# Patient Record
Sex: Male | Born: 1950 | Race: White | Hispanic: No | Smoking: Former smoker
Health system: Southern US, Community
[De-identification: ages and names within clinical notes are randomized; demographics above are authoritative.]

## PROBLEM LIST (undated history)

## (undated) DIAGNOSIS — I10 Essential (primary) hypertension: Secondary | ICD-10-CM

## (undated) DIAGNOSIS — I8393 Asymptomatic varicose veins of bilateral lower extremities: Secondary | ICD-10-CM

## (undated) DIAGNOSIS — I739 Peripheral vascular disease, unspecified: Secondary | ICD-10-CM

## (undated) DIAGNOSIS — I2510 Atherosclerotic heart disease of native coronary artery without angina pectoris: Secondary | ICD-10-CM

## (undated) DIAGNOSIS — E785 Hyperlipidemia, unspecified: Secondary | ICD-10-CM

## (undated) DIAGNOSIS — H349 Unspecified retinal vascular occlusion: Secondary | ICD-10-CM

## (undated) DIAGNOSIS — I779 Disorder of arteries and arterioles, unspecified: Secondary | ICD-10-CM

## (undated) DIAGNOSIS — H47019 Ischemic optic neuropathy, unspecified eye: Secondary | ICD-10-CM

## (undated) HISTORY — DX: Atherosclerotic heart disease of native coronary artery without angina pectoris: I25.10

## (undated) HISTORY — DX: Hyperlipidemia, unspecified: E78.5

## (undated) HISTORY — PX: EYE SURGERY: SHX253

## (undated) HISTORY — DX: Essential (primary) hypertension: I10

## (undated) HISTORY — DX: Peripheral vascular disease, unspecified: I73.9

## (undated) HISTORY — DX: Disorder of arteries and arterioles, unspecified: I77.9

## (undated) HISTORY — DX: Ischemic optic neuropathy, unspecified eye: H47.019

## (undated) HISTORY — PX: DENTAL SURGERY: SHX609

## (undated) HISTORY — DX: Asymptomatic varicose veins of bilateral lower extremities: I83.93

## (undated) HISTORY — DX: Unspecified retinal vascular occlusion: H34.9

---
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
## 2010-04-15 ENCOUNTER — Emergency Department (HOSPITAL_COMMUNITY): Admission: EM | Admit: 2010-04-15 | Discharge: 2010-04-16 | Payer: Self-pay | Admitting: Emergency Medicine

## 2010-11-09 LAB — DIFFERENTIAL
Basophils Absolute: 0.1 10*3/uL (ref 0.0–0.1)
Lymphocytes Relative: 37 % (ref 12–46)
Lymphs Abs: 1.7 10*3/uL (ref 0.7–4.0)
Monocytes Absolute: 0.4 10*3/uL (ref 0.1–1.0)
Neutro Abs: 2.3 10*3/uL (ref 1.7–7.7)
Neutrophils Relative %: 49 % (ref 43–77)

## 2010-11-09 LAB — CBC
HCT: 39.2 % (ref 39.0–52.0)
Hemoglobin: 14 g/dL (ref 13.0–17.0)
MCH: 31.4 pg (ref 26.0–34.0)
MCV: 87.9 fL (ref 78.0–100.0)
Platelets: 187 10*3/uL (ref 150–400)
RBC: 4.46 MIL/uL (ref 4.22–5.81)
WBC: 4.7 10*3/uL (ref 4.0–10.5)

## 2010-11-09 LAB — BASIC METABOLIC PANEL: Chloride: 105 mEq/L (ref 96–112)

## 2016-07-05 DIAGNOSIS — S61012A Laceration without foreign body of left thumb without damage to nail, initial encounter: Secondary | ICD-10-CM | POA: Diagnosis not present

## 2016-08-09 DIAGNOSIS — H34232 Retinal artery branch occlusion, left eye: Secondary | ICD-10-CM | POA: Diagnosis not present

## 2016-08-15 DIAGNOSIS — H47012 Ischemic optic neuropathy, left eye: Secondary | ICD-10-CM | POA: Diagnosis not present

## 2016-08-15 DIAGNOSIS — R002 Palpitations: Secondary | ICD-10-CM | POA: Diagnosis not present

## 2016-08-15 DIAGNOSIS — E668 Other obesity: Secondary | ICD-10-CM | POA: Diagnosis not present

## 2016-08-20 ENCOUNTER — Other Ambulatory Visit: Payer: Self-pay | Admitting: Cardiology

## 2016-08-20 ENCOUNTER — Ambulatory Visit (HOSPITAL_COMMUNITY)
Admission: RE | Admit: 2016-08-20 | Discharge: 2016-08-20 | Disposition: A | Discharge status: Home / Self Care | Payer: PPO | Source: Ambulatory Visit | Attending: Vascular Surgery | Admitting: Vascular Surgery

## 2016-08-20 DIAGNOSIS — H47019 Ischemic optic neuropathy, unspecified eye: Secondary | ICD-10-CM

## 2016-08-20 LAB — VAS US CAROTID
LCCADDIAS: 24 cm/s
LCCADSYS: 101 cm/s
LCCAPDIAS: 26 cm/s
LEFT ECA DIAS: -18 cm/s
LICADDIAS: -22 cm/s
LICAPDIAS: -20 cm/s
Left CCA prox sys: 145 cm/s
Left ICA dist sys: -71 cm/s
Left ICA prox sys: -83 cm/s
RCCADSYS: -69 cm/s
RCCAPSYS: 120 cm/s
RIGHT CCA MID DIAS: 29 cm/s
RIGHT ECA DIAS: -25 cm/s
Right CCA prox dias: 31 cm/s

## 2016-09-30 DIAGNOSIS — H34231 Retinal artery branch occlusion, right eye: Secondary | ICD-10-CM | POA: Diagnosis not present

## 2016-10-04 DIAGNOSIS — E785 Hyperlipidemia, unspecified: Secondary | ICD-10-CM | POA: Diagnosis not present

## 2016-10-04 DIAGNOSIS — E668 Other obesity: Secondary | ICD-10-CM | POA: Diagnosis not present

## 2016-10-04 DIAGNOSIS — H47012 Ischemic optic neuropathy, left eye: Secondary | ICD-10-CM | POA: Diagnosis not present

## 2016-10-04 DIAGNOSIS — R030 Elevated blood-pressure reading, without diagnosis of hypertension: Secondary | ICD-10-CM | POA: Diagnosis not present

## 2016-10-04 DIAGNOSIS — I119 Hypertensive heart disease without heart failure: Secondary | ICD-10-CM | POA: Diagnosis not present

## 2016-10-04 DIAGNOSIS — H34231 Retinal artery branch occlusion, right eye: Secondary | ICD-10-CM | POA: Diagnosis not present

## 2016-10-30 DIAGNOSIS — Z87891 Personal history of nicotine dependence: Secondary | ICD-10-CM | POA: Diagnosis not present

## 2016-10-30 DIAGNOSIS — L570 Actinic keratosis: Secondary | ICD-10-CM | POA: Diagnosis not present

## 2016-10-30 DIAGNOSIS — H47012 Ischemic optic neuropathy, left eye: Secondary | ICD-10-CM | POA: Diagnosis not present

## 2016-10-30 DIAGNOSIS — I519 Heart disease, unspecified: Secondary | ICD-10-CM | POA: Diagnosis not present

## 2016-10-30 DIAGNOSIS — E668 Other obesity: Secondary | ICD-10-CM | POA: Diagnosis not present

## 2016-10-30 DIAGNOSIS — Z125 Encounter for screening for malignant neoplasm of prostate: Secondary | ICD-10-CM | POA: Diagnosis not present

## 2016-10-30 DIAGNOSIS — I517 Cardiomegaly: Secondary | ICD-10-CM | POA: Diagnosis not present

## 2016-10-30 DIAGNOSIS — Z23 Encounter for immunization: Secondary | ICD-10-CM | POA: Diagnosis not present

## 2016-10-30 DIAGNOSIS — I119 Hypertensive heart disease without heart failure: Secondary | ICD-10-CM | POA: Diagnosis not present

## 2016-10-30 DIAGNOSIS — Z6829 Body mass index (BMI) 29.0-29.9, adult: Secondary | ICD-10-CM | POA: Diagnosis not present

## 2016-10-30 DIAGNOSIS — E7800 Pure hypercholesterolemia, unspecified: Secondary | ICD-10-CM | POA: Diagnosis not present

## 2016-10-30 DIAGNOSIS — I10 Essential (primary) hypertension: Secondary | ICD-10-CM | POA: Diagnosis not present

## 2016-11-01 ENCOUNTER — Encounter: Payer: Self-pay | Admitting: Gastroenterology

## 2016-11-13 DIAGNOSIS — H43813 Vitreous degeneration, bilateral: Secondary | ICD-10-CM | POA: Diagnosis not present

## 2016-11-13 DIAGNOSIS — H34832 Tributary (branch) retinal vein occlusion, left eye, with macular edema: Secondary | ICD-10-CM | POA: Diagnosis not present

## 2016-11-13 DIAGNOSIS — H34232 Retinal artery branch occlusion, left eye: Secondary | ICD-10-CM | POA: Diagnosis not present

## 2016-11-18 DIAGNOSIS — D485 Neoplasm of uncertain behavior of skin: Secondary | ICD-10-CM | POA: Diagnosis not present

## 2016-11-18 DIAGNOSIS — L280 Lichen simplex chronicus: Secondary | ICD-10-CM | POA: Diagnosis not present

## 2016-11-18 DIAGNOSIS — L570 Actinic keratosis: Secondary | ICD-10-CM | POA: Diagnosis not present

## 2016-11-18 DIAGNOSIS — L905 Scar conditions and fibrosis of skin: Secondary | ICD-10-CM | POA: Diagnosis not present

## 2016-11-20 ENCOUNTER — Ambulatory Visit (AMBULATORY_SURGERY_CENTER): Payer: Self-pay | Admitting: *Deleted

## 2016-11-20 VITALS — Ht 69.0 in | Wt 197.0 lb

## 2016-11-20 DIAGNOSIS — Z1211 Encounter for screening for malignant neoplasm of colon: Secondary | ICD-10-CM

## 2016-11-20 MED ORDER — NA SULFATE-K SULFATE-MG SULF 17.5-3.13-1.6 GM/177ML PO SOLN: 1.0000 | Freq: Once | ORAL | 0 refills | Status: AC

## 2016-11-20 NOTE — Progress Notes (Signed)
Pt states has had cardiac work up through Tollie Eth MD- cardio- had pt complete records release and to Dr Loletha Carrow" CMA. Vivien Rota todd CMA to obtain records- pt states no issues but carotid plaque. Will need echo report- pt informed   No egg or soy allergy known to patient  No issues with past sedation with any surgeries  or procedures, no intubation problems  No diet pills per patient No home 02 use per patient  No blood thinners per patient  Pt states recent  issues with  Mild constipation - uses metamucil daily  No A fib or A flutter   emmi video to e mail

## 2016-11-21 ENCOUNTER — Encounter: Payer: Self-pay | Admitting: Gastroenterology

## 2016-12-05 ENCOUNTER — Ambulatory Visit (AMBULATORY_SURGERY_CENTER): Payer: PPO | Admitting: Gastroenterology

## 2016-12-05 ENCOUNTER — Encounter: Payer: Self-pay | Admitting: Gastroenterology

## 2016-12-05 VITALS — BP 119/73 | HR 61 | Temp 98.9°F | Resp 12 | Ht 69.0 in | Wt 197.0 lb

## 2016-12-05 DIAGNOSIS — Z1212 Encounter for screening for malignant neoplasm of rectum: Secondary | ICD-10-CM

## 2016-12-05 DIAGNOSIS — Z1211 Encounter for screening for malignant neoplasm of colon: Secondary | ICD-10-CM

## 2016-12-05 MED ORDER — SODIUM CHLORIDE 0.9 % IV SOLN: 500.0000 mL | INTRAVENOUS | Status: DC

## 2016-12-05 NOTE — Op Note (Signed)
[place] Patient Name: Johnny Lopez Procedure Date: 12/05/2016 2:02 PM MRN: [id number] Endoscopist: Loretto Loletha Carrow , MD Age: 66 Referring MD:  Date of Birth: 20-Aug-1951 Gender: Male Account #: [id number] Procedure:                Colonoscopy Indications:              Screening for colorectal malignant neoplasm, This                            is the patient's first colonoscopy Medicines:                Monitored Anesthesia Care Procedure:                Pre-Anesthesia Assessment:                           - Prior to the procedure, a History and Physical                            was performed, and patient medications and                            allergies were reviewed. The patient's tolerance of                            previous anesthesia was also reviewed. The risks                            and benefits of the procedure and the sedation                            options and risks were discussed with the patient.                            All questions were answered, and informed consent                            was obtained. Anticoagulants: The patient has taken                            aspirin. It was decided not to withhold this                            medication prior to the procedure. ASA Grade                            Assessment: II - A patient with mild systemic                            disease. After reviewing the risks and benefits,                            the patient was deemed in satisfactory condition to  undergo the procedure.                           After obtaining informed consent, the colonoscope                            was passed under direct vision. Throughout the                            procedure, the patient's blood pressure, pulse, and                            oxygen saturations were monitored continuously. The                            Model CF-HQ190L [id number]) scope was introduced                           through the anus and advanced to the the cecum,                            identified by appendiceal orifice and ileocecal                            valve. The colonoscopy was performed without                            difficulty. The patient tolerated the procedure                            well. The quality of the bowel preparation was good                            after lavage. The ileocecal valve, appendiceal                            orifice, and rectum were photographed. The bowel                            preparation used was SUPREP. The quality of the                            bowel preparation was evaluated using the BBPS                            [facility] Bowel Preparation Scale) with scores of:                            Right Colon = 2, Transverse Colon = 2 and Left                            Colon = 2. The total BBPS score equals 6. Scope In: 2:09:04 PM Scope Out: 2:24:29 PM Scope Withdrawal Time: 0 hours 9 minutes 7 seconds  Total Procedure Duration: 0 hours 15 minutes 25 seconds  Findings:                 The perianal and digital rectal examinations were                            normal.                           Multiple diverticula were found in the left colon.                           Internal hemorrhoids were found during                            retroflexion. The hemorrhoids were medium-sized and                            Grade I (internal hemorrhoids that do not prolapse).                           The exam was otherwise without abnormality on                            direct and retroflexion views. Complications:            No immediate complications. Estimated Blood Loss:     Estimated blood loss: none. Impression:               - Diverticulosis in the left colon.                           - Internal hemorrhoids.                           - The examination was otherwise normal on direct                            and  retroflexion views.                           - No specimens collected. Recommendation:           - Patient has a contact number available for                            emergencies. The signs and symptoms of potential                            delayed complications were discussed with the                            patient. Return to normal activities tomorrow.                            Written discharge instructions were provided to the  patient.                           - Resume previous diet.                           - Continue present medications.                           - Repeat colonoscopy in 10 years for screening                            purposes. Kaedynce Tapp L. Loletha Carrow, MD 12/05/2016 2:28:34 PM This report has been signed electronically.

## 2016-12-05 NOTE — Patient Instructions (Signed)
**  Handouts given on diverticulosis and hemorrhoids**   YOU HAD AN ENDOSCOPIC PROCEDURE TODAY: Refer to the procedure report and other information in the discharge instructions given to you for any specific questions about what was found during the examination. If this information does not answer your questions, please call [place] office at [phone] to clarify.   YOU SHOULD EXPECT: Some feelings of bloating in the abdomen. Passage of more gas than usual. Walking can help get rid of the air that was put into your GI tract during the procedure and reduce the bloating. If you had a lower endoscopy (such as a colonoscopy or flexible sigmoidoscopy) you may notice spotting of blood in your stool or on the toilet paper. Some abdominal soreness may be present for a day or two, also.  DIET: Your first meal following the procedure should be a light meal and then it is ok to progress to your normal diet. A half-sandwich or bowl of soup is an example of a good first meal. Heavy or fried foods are harder to digest and may make you feel nauseous or bloated. Drink plenty of fluids but you should avoid alcoholic beverages for 24 hours. If you had a esophageal dilation, please see attached instructions for diet.    ACTIVITY: Your care partner should take you home directly after the procedure. You should plan to take it easy, moving slowly for the rest of the day. You can resume normal activity the day after the procedure however YOU SHOULD NOT DRIVE, use power tools, machinery or perform tasks that involve climbing or major physical exertion for 24 hours (because of the sedation medicines used during the test).   SYMPTOMS TO REPORT IMMEDIATELY: A gastroenterologist can be reached at any hour. Please call [phone]  for any of the following symptoms:  Following lower endoscopy (colonoscopy, flexible sigmoidoscopy) Excessive amounts of blood in the stool  Significant tenderness, worsening of abdominal pains   Swelling of the abdomen that is new, acute  Fever of 100 or higher    FOLLOW UP:  If any biopsies were taken you will be contacted by phone or by letter within the next 1-3 weeks. Call [phone]  if you have not heard about the biopsies in 3 weeks.  Please also call with any specific questions about appointments or follow up tests.  

## 2016-12-05 NOTE — Progress Notes (Signed)
Report given to PACU, vss 

## 2016-12-06 ENCOUNTER — Telehealth: Payer: Self-pay

## 2016-12-06 NOTE — Telephone Encounter (Signed)
  Follow up Call-  Call back number 12/05/2016  Post procedure Call Back phone  # [phone]  Permission to leave phone message Yes  Some recent data might be hidden     Patient questions:  Do you have a fever, pain , or abdominal swelling? No. Pain Score  0 *  Have you tolerated food without any problems? Yes.    Have you been able to return to your normal activities? Yes.    Do you have any questions about your discharge instructions: Diet   No. Medications  No. Follow up visit  No.  Do you have questions or concerns about your Care? No.  Actions: * If pain score is 4 or above: No action needed, pain <4.

## 2017-05-09 DIAGNOSIS — E668 Other obesity: Secondary | ICD-10-CM | POA: Diagnosis not present

## 2017-05-09 DIAGNOSIS — I119 Hypertensive heart disease without heart failure: Secondary | ICD-10-CM | POA: Diagnosis not present

## 2017-05-09 DIAGNOSIS — I10 Essential (primary) hypertension: Secondary | ICD-10-CM | POA: Diagnosis not present

## 2017-05-09 DIAGNOSIS — I519 Heart disease, unspecified: Secondary | ICD-10-CM | POA: Diagnosis not present

## 2017-05-09 DIAGNOSIS — Z87891 Personal history of nicotine dependence: Secondary | ICD-10-CM | POA: Diagnosis not present

## 2017-05-09 DIAGNOSIS — E7800 Pure hypercholesterolemia, unspecified: Secondary | ICD-10-CM | POA: Diagnosis not present

## 2017-05-09 DIAGNOSIS — H34232 Retinal artery branch occlusion, left eye: Secondary | ICD-10-CM | POA: Diagnosis not present

## 2017-05-09 DIAGNOSIS — Z6829 Body mass index (BMI) 29.0-29.9, adult: Secondary | ICD-10-CM | POA: Diagnosis not present

## 2017-05-09 DIAGNOSIS — H47012 Ischemic optic neuropathy, left eye: Secondary | ICD-10-CM | POA: Diagnosis not present

## 2017-05-09 DIAGNOSIS — I517 Cardiomegaly: Secondary | ICD-10-CM | POA: Diagnosis not present

## 2017-10-29 DIAGNOSIS — R82998 Other abnormal findings in urine: Secondary | ICD-10-CM | POA: Diagnosis not present

## 2017-10-29 DIAGNOSIS — I10 Essential (primary) hypertension: Secondary | ICD-10-CM | POA: Diagnosis not present

## 2017-10-29 DIAGNOSIS — Z125 Encounter for screening for malignant neoplasm of prostate: Secondary | ICD-10-CM | POA: Diagnosis not present

## 2017-10-29 DIAGNOSIS — E7800 Pure hypercholesterolemia, unspecified: Secondary | ICD-10-CM | POA: Diagnosis not present

## 2017-11-05 DIAGNOSIS — I519 Heart disease, unspecified: Secondary | ICD-10-CM | POA: Diagnosis not present

## 2017-11-05 DIAGNOSIS — I517 Cardiomegaly: Secondary | ICD-10-CM | POA: Diagnosis not present

## 2017-11-05 DIAGNOSIS — E668 Other obesity: Secondary | ICD-10-CM | POA: Diagnosis not present

## 2017-11-05 DIAGNOSIS — H47012 Ischemic optic neuropathy, left eye: Secondary | ICD-10-CM | POA: Diagnosis not present

## 2017-11-05 DIAGNOSIS — Z6827 Body mass index (BMI) 27.0-27.9, adult: Secondary | ICD-10-CM | POA: Diagnosis not present

## 2017-11-05 DIAGNOSIS — Z87891 Personal history of nicotine dependence: Secondary | ICD-10-CM | POA: Diagnosis not present

## 2017-11-05 DIAGNOSIS — I119 Hypertensive heart disease without heart failure: Secondary | ICD-10-CM | POA: Diagnosis not present

## 2017-11-05 DIAGNOSIS — H34232 Retinal artery branch occlusion, left eye: Secondary | ICD-10-CM | POA: Diagnosis not present

## 2017-11-05 DIAGNOSIS — E7800 Pure hypercholesterolemia, unspecified: Secondary | ICD-10-CM | POA: Diagnosis not present

## 2017-11-05 DIAGNOSIS — Z23 Encounter for immunization: Secondary | ICD-10-CM | POA: Diagnosis not present

## 2017-11-05 DIAGNOSIS — Z0000 Encounter for general adult medical examination without abnormal findings: Secondary | ICD-10-CM | POA: Diagnosis not present

## 2017-11-05 DIAGNOSIS — Z1389 Encounter for screening for other disorder: Secondary | ICD-10-CM | POA: Diagnosis not present

## 2018-01-08 DIAGNOSIS — Z1212 Encounter for screening for malignant neoplasm of rectum: Secondary | ICD-10-CM | POA: Diagnosis not present

## 2018-10-30 DIAGNOSIS — R82998 Other abnormal findings in urine: Secondary | ICD-10-CM | POA: Diagnosis not present

## 2018-10-30 DIAGNOSIS — E7800 Pure hypercholesterolemia, unspecified: Secondary | ICD-10-CM | POA: Diagnosis not present

## 2018-10-30 DIAGNOSIS — Z125 Encounter for screening for malignant neoplasm of prostate: Secondary | ICD-10-CM | POA: Diagnosis not present

## 2018-10-30 DIAGNOSIS — I10 Essential (primary) hypertension: Secondary | ICD-10-CM | POA: Diagnosis not present

## 2018-11-06 DIAGNOSIS — Z1331 Encounter for screening for depression: Secondary | ICD-10-CM | POA: Diagnosis not present

## 2018-11-06 DIAGNOSIS — I868 Varicose veins of other specified sites: Secondary | ICD-10-CM | POA: Diagnosis not present

## 2018-11-06 DIAGNOSIS — H34232 Retinal artery branch occlusion, left eye: Secondary | ICD-10-CM | POA: Diagnosis not present

## 2018-11-06 DIAGNOSIS — F5221 Male erectile disorder: Secondary | ICD-10-CM | POA: Diagnosis not present

## 2018-11-06 DIAGNOSIS — I119 Hypertensive heart disease without heart failure: Secondary | ICD-10-CM | POA: Diagnosis not present

## 2018-11-06 DIAGNOSIS — I517 Cardiomegaly: Secondary | ICD-10-CM | POA: Diagnosis not present

## 2018-11-06 DIAGNOSIS — H47012 Ischemic optic neuropathy, left eye: Secondary | ICD-10-CM | POA: Diagnosis not present

## 2018-11-06 DIAGNOSIS — E7800 Pure hypercholesterolemia, unspecified: Secondary | ICD-10-CM | POA: Diagnosis not present

## 2018-11-06 DIAGNOSIS — Z87891 Personal history of nicotine dependence: Secondary | ICD-10-CM | POA: Diagnosis not present

## 2018-11-06 DIAGNOSIS — E663 Overweight: Secondary | ICD-10-CM | POA: Diagnosis not present

## 2018-11-06 DIAGNOSIS — Z0000 Encounter for general adult medical examination without abnormal findings: Secondary | ICD-10-CM | POA: Diagnosis not present

## 2019-04-16 DIAGNOSIS — Z20828 Contact with and (suspected) exposure to other viral communicable diseases: Secondary | ICD-10-CM | POA: Diagnosis not present

## 2019-11-10 DIAGNOSIS — E7800 Pure hypercholesterolemia, unspecified: Secondary | ICD-10-CM | POA: Diagnosis not present

## 2019-11-10 DIAGNOSIS — Z125 Encounter for screening for malignant neoplasm of prostate: Secondary | ICD-10-CM | POA: Diagnosis not present

## 2019-11-16 DIAGNOSIS — I868 Varicose veins of other specified sites: Secondary | ICD-10-CM | POA: Diagnosis not present

## 2019-11-16 DIAGNOSIS — Z87891 Personal history of nicotine dependence: Secondary | ICD-10-CM | POA: Diagnosis not present

## 2019-11-16 DIAGNOSIS — I119 Hypertensive heart disease without heart failure: Secondary | ICD-10-CM | POA: Diagnosis not present

## 2019-11-16 DIAGNOSIS — H47012 Ischemic optic neuropathy, left eye: Secondary | ICD-10-CM | POA: Diagnosis not present

## 2019-11-16 DIAGNOSIS — Z1339 Encounter for screening examination for other mental health and behavioral disorders: Secondary | ICD-10-CM | POA: Diagnosis not present

## 2019-11-16 DIAGNOSIS — Z0000 Encounter for general adult medical examination without abnormal findings: Secondary | ICD-10-CM | POA: Diagnosis not present

## 2019-11-16 DIAGNOSIS — E669 Obesity, unspecified: Secondary | ICD-10-CM | POA: Diagnosis not present

## 2019-11-16 DIAGNOSIS — I519 Heart disease, unspecified: Secondary | ICD-10-CM | POA: Diagnosis not present

## 2019-11-16 DIAGNOSIS — Z1331 Encounter for screening for depression: Secondary | ICD-10-CM | POA: Diagnosis not present

## 2019-11-16 DIAGNOSIS — F5221 Male erectile disorder: Secondary | ICD-10-CM | POA: Diagnosis not present

## 2019-11-16 DIAGNOSIS — I517 Cardiomegaly: Secondary | ICD-10-CM | POA: Diagnosis not present

## 2019-11-16 DIAGNOSIS — H34232 Retinal artery branch occlusion, left eye: Secondary | ICD-10-CM | POA: Diagnosis not present

## 2019-11-16 DIAGNOSIS — D692 Other nonthrombocytopenic purpura: Secondary | ICD-10-CM | POA: Diagnosis not present

## 2019-11-16 DIAGNOSIS — E7800 Pure hypercholesterolemia, unspecified: Secondary | ICD-10-CM | POA: Diagnosis not present

## 2020-11-15 DIAGNOSIS — E7800 Pure hypercholesterolemia, unspecified: Secondary | ICD-10-CM | POA: Diagnosis not present

## 2020-11-15 DIAGNOSIS — Z125 Encounter for screening for malignant neoplasm of prostate: Secondary | ICD-10-CM | POA: Diagnosis not present

## 2020-11-22 DIAGNOSIS — I517 Cardiomegaly: Secondary | ICD-10-CM | POA: Diagnosis not present

## 2020-11-22 DIAGNOSIS — I868 Varicose veins of other specified sites: Secondary | ICD-10-CM | POA: Diagnosis not present

## 2020-11-22 DIAGNOSIS — I519 Heart disease, unspecified: Secondary | ICD-10-CM | POA: Diagnosis not present

## 2020-11-22 DIAGNOSIS — Z1339 Encounter for screening examination for other mental health and behavioral disorders: Secondary | ICD-10-CM | POA: Diagnosis not present

## 2020-11-22 DIAGNOSIS — E663 Overweight: Secondary | ICD-10-CM | POA: Diagnosis not present

## 2020-11-22 DIAGNOSIS — L570 Actinic keratosis: Secondary | ICD-10-CM | POA: Diagnosis not present

## 2020-11-22 DIAGNOSIS — I119 Hypertensive heart disease without heart failure: Secondary | ICD-10-CM | POA: Diagnosis not present

## 2020-11-22 DIAGNOSIS — R82998 Other abnormal findings in urine: Secondary | ICD-10-CM | POA: Diagnosis not present

## 2020-11-22 DIAGNOSIS — Z0000 Encounter for general adult medical examination without abnormal findings: Secondary | ICD-10-CM | POA: Diagnosis not present

## 2020-11-22 DIAGNOSIS — Z1331 Encounter for screening for depression: Secondary | ICD-10-CM | POA: Diagnosis not present

## 2020-11-22 DIAGNOSIS — E7800 Pure hypercholesterolemia, unspecified: Secondary | ICD-10-CM | POA: Diagnosis not present

## 2020-11-22 DIAGNOSIS — D692 Other nonthrombocytopenic purpura: Secondary | ICD-10-CM | POA: Diagnosis not present

## 2020-12-26 NOTE — Progress Notes (Signed)
Date:  12/27/2020   ID:  Pricilla Handler, DOB 1951/04/07, MRN [id number]  PCP:  Haywood Pao, MD  Cardiologist:  Rex Kras, DO, [facility] (established care 12/27/2020) Former Cardiology Providers: Dr. Wynonia Lawman  REASON FOR CONSULT: Diastolic HF, high risk lipids, Hypertensie heart diease without heart failure  REQUESTING PHYSICIAN:  Tisovec, Fransico Him, MD [address] [place],  [place] [address]  Chief Complaint  Patient presents with  . Hypertension  . Carotid artery disea  . Hyperlipidemia  . New Patient (Initial Visit)    HPI  Johnny Lopez is a 70 y.o. male who presents to the office with a chief complaint of " establish care and for hyperlipidemia, coronary artery disease, history of diastolic dysfunction." Patient's past medical history and cardiovascular risk factors include: Hyperlipidemia, former smoker, carotid artery atherosclerosis, retinal artery occlusion, ischemic optic neuropathy, hypertension, varicose veins, hypertensive heart disease, diastolic dysfunction, advanced age.   He is referred to the office at the request of Tisovec, Fransico Him, MD for evaluation of hypertensie heart diease without heart failure.  Patient presents today for evaluation for cardiovascular disease given his multiple cardiovascular risk factors.  He used to follow-up with Dr. Wynonia Lawman prior to his retirement and thereafter has not reestablish care until now.  Patient is currently asymptomatic and does not have any chest pain or shortness of breath at rest or with effort late activities.  No known coronary artery disease.  Patient works as a Games developer and lives a very active lifestyle and on the weekends walks his dog as well.  He has not noticed any change in physical endurance.  But admits that at times is difficult to follow a heart healthy diet which is contributing to his underlying comorbid conditions.   ALLERGIES: No Known Allergies  MEDICATION LIST PRIOR TO VISIT: Current Meds   Medication Sig  . aspirin 81 MG chewable tablet Chew by mouth daily.  Marland Kitchen atorvastatin (LIPITOR) 40 MG tablet Take 40 mg by mouth at bedtime.  . Coenzyme Q10 (CO Q 10 PO) Take 100 mg by mouth daily. Liquid  . Multiple Vitamin (MULTIVITAMIN) tablet Take 1 tablet by mouth daily.  . Multiple Vitamins-Minerals (OCUVITE EYE HEALTH FORMULA PO) Take 1 capsule by mouth daily.  . Omega-3 Fatty Acids (OMEGA-3 FISH OIL) 1200 MG CAPS Take 1,200 mg by mouth daily.   Current Facility-Administered Medications for the 12/27/20 encounter (Office Visit) with Rex Kras, DO  Medication  . 0.9 %  sodium chloride infusion     PAST MEDICAL HISTORY: Past Medical History:  Diagnosis Date  . Carotid artery disease (HCC)    little plaque build up in the carotids  . Hyperlipidemia    on meds  . Hypertension    past history -lost weight  . Ischemic optic neuropathy   . Retinal artery occlusion   . Varicose veins of both lower extremities     PAST SURGICAL HISTORY: Past Surgical History:  Procedure Laterality Date  . DENTAL SURGERY    . TONSILLECTOMY  1962    FAMILY HISTORY: The patient family history includes Cancer in his father; Stroke (age of onset: 57) in his mother.  SOCIAL HISTORY:  The patient  reports that he quit smoking about 26 years ago. His smoking use included cigarettes. He has a 37.50 pack-year smoking history. He has never used smokeless tobacco. He reports that he does not drink alcohol and does not use drugs.  REVIEW OF SYSTEMS: Review of Systems  Constitutional: Negative for chills and fever.  HENT: Negative for hoarse voice and nosebleeds.   Eyes: Negative for discharge, double vision and pain.  Cardiovascular: Negative for chest pain, claudication, dyspnea on exertion, leg swelling, near-syncope, orthopnea, palpitations, paroxysmal nocturnal dyspnea and syncope.  Respiratory: Negative for hemoptysis and shortness of breath.   Musculoskeletal: Negative for muscle cramps and  myalgias.  Gastrointestinal: Negative for abdominal pain, constipation, diarrhea, hematemesis, hematochezia, melena, nausea and vomiting.  Neurological: Negative for dizziness and light-headedness.    PHYSICAL EXAM: Vitals with BMI 12/27/2020 12/05/2016 12/05/2016  Height $Remov'5\' 9"'fevHQk$  - -  Weight 206 lbs - -  BMI 52.84 - -  Systolic 132 440 102  Diastolic 85 73 69  Pulse 70 61 67    CONSTITUTIONAL: Well-developed and well-nourished. No acute distress.  SKIN: Skin is warm and dry. No rash noted. No cyanosis. No pallor. No jaundice HEAD: Normocephalic and atraumatic.  EYES: No scleral icterus MOUTH/THROAT: Moist oral membranes.  NECK: No JVD present. No thyromegaly noted. Right carotid bruits  LYMPHATIC: No visible cervical adenopathy.  CHEST Normal respiratory effort. No intercostal retractions  LUNGS: Clear to auscultation bilaterally.  No stridor. No wheezes. No rales.  CARDIOVASCULAR: Regular rate and rhythm, positive S1-S2, no murmurs rubs or gallops appreciated. ABDOMINAL: No apparent ascites.  EXTREMITIES: No peripheral edema  HEMATOLOGIC: No significant bruising NEUROLOGIC: Oriented to person, place, and time. Nonfocal. Normal muscle tone.  PSYCHIATRIC: Normal mood and affect. Normal behavior. Cooperative  CARDIAC DATABASE: EKG: 12/27/2020: NSR, 69bpm, PRWP, without underlying injury pattern.   Echocardiogram: No results found for this or any previous visit from the past 1095 days.   Stress Testing: No results found for this or any previous visit from the past 1095 days.  Heart Catheterization: None  LABORATORY DATA: CBC Latest Ref Rng & Units 04/15/2010  WBC 4.0 - 10.5 K/uL 4.7  Hemoglobin 13.0 - 17.0 g/dL 14.0  Hematocrit 39.0 - 52.0 % 39.2  Platelets 150 - 400 K/uL 187    CMP Latest Ref Rng & Units 04/15/2010  Glucose 70 - 99 mg/dL 119(H)  BUN 6 - 23 mg/dL 12  Creatinine 0.4 - 1.5 mg/dL 0.91  Sodium 135 - 145 mEq/L 139  Potassium 3.5 - 5.1 mEq/L 3.9  Chloride 96  - 112 mEq/L 105  CO2 19 - 32 mEq/L 28  Calcium 8.4 - 10.5 mg/dL 9.4    Lipid Panel  No results found for: CHOL, TRIG, HDL, CHOLHDL, VLDL, LDLCALC, LDLDIRECT, LABVLDL  No components found for: NTPROBNP No results for input(s): PROBNP in the last 8760 hours. No results for input(s): TSH in the last 8760 hours.  BMP No results for input(s): NA, K, CL, CO2, GLUCOSE, BUN, CREATININE, CALCIUM, GFRNONAA, GFRAA in the last 8760 hours.  HEMOGLOBIN A1C No results found for: HGBA1C, MPG  External Labs: Collected: 11/15/2020 Hemoglobin 14.6 g/dL, hematocrit 44.6% Creatinine 0.9 mg/dL. eGFR: >60 mL/min per 1.73 m Lipid profile: Total cholesterol 183, triglycerides 72, HDL 43, LDL 126, non-HDL 140, apolipoprotein B 104 (normal 49-90)  IMPRESSION:    ICD-10-CM   1. Pure hypercholesterolemia  E78.00 CT CARDIAC SCORING (SELF PAY ONLY)    Lipid Panel With LDL/HDL Ratio    LDL cholesterol, direct    CMP14+EGFR  2. Primary hypertension  I10 EKG 12-Lead    PCV ECHOCARDIOGRAM COMPLETE  3. Former smoker  Z87.891   4. Atherosclerosis of both carotid arteries  I65.23 PCV CAROTID DUPLEX (BILATERAL)    CT CARDIAC SCORING (SELF PAY ONLY)  5. Hx of diastolic dysfunction  V25.36  RECOMMENDATIONS: Terrin Meddaugh is a 70 y.o. male whose past medical history and cardiac risk factors include: Hyperlipidemia, former smoker, carotid artery atherosclerosis, retinal artery occlusion, ischemic optic neuropathy, hypertension, varicose veins, hypertensive heart disease, diastolic dysfunction, advanced age.  Patient used to follow-up with Dr. Wynonia Lawman for management of his modifiable cardiovascular risk factors given his cardiovascular comorbidities.  However since his retirement he has not followed up with cardiology and is here to establish care.  Given the recent lipid profile from March 2022 patient informs me that his PCP uptitrated Lipitor to 40 mg p.o. nightly.  I agree with his recommendation.  We will  check a fasting lipid profile prior to the next office visit to reevaluate therapy.  Given his carotid bruit on examination and history of coronary artery atherosclerosis we will repeat a carotid duplex for further evaluation.  Continue aspirin and statin therapy.   Patient has history of carotid artery atherosclerosis, retinal artery occlusion, and ischemic optic neuropathy therefore we discussed further risk ratification with regards to ASCVD.  We will check a coronary artery calcium score for further risk stratification.  We will discuss stress testing at the next visit.  Patient has history of diastolic dysfunction and given his comorbid conditions such as hypertension, hyperlipidemia would recommend echocardiogram to evaluate for structural heart disease.  No other medication changes were recommended during today's encounter.  I will see the patient back in about 4 weeks time to reevaluate his lipid management and to review the test results that were ordered for diagnostic purposes.  FINAL MEDICATION LIST END OF ENCOUNTER: No orders of the defined types were placed in this encounter.   Medications Discontinued During This Encounter  Medication Reason  . OVER THE COUNTER MEDICATION Error     Current Outpatient Medications:  .  aspirin 81 MG chewable tablet, Chew by mouth daily., Disp: , Rfl:  .  atorvastatin (LIPITOR) 40 MG tablet, Take 40 mg by mouth at bedtime., Disp: , Rfl:  .  Coenzyme Q10 (CO Q 10 PO), Take 100 mg by mouth daily. Liquid, Disp: , Rfl:  .  Multiple Vitamin (MULTIVITAMIN) tablet, Take 1 tablet by mouth daily., Disp: , Rfl:  .  Multiple Vitamins-Minerals (OCUVITE EYE HEALTH FORMULA PO), Take 1 capsule by mouth daily., Disp: , Rfl:  .  Omega-3 Fatty Acids (OMEGA-3 FISH OIL) 1200 MG CAPS, Take 1,200 mg by mouth daily., Disp: , Rfl:   Current Facility-Administered Medications:  .  0.9 %  sodium chloride infusion, 500 mL, Intravenous, Continuous, Danis, Kirke Corin,  MD  Orders Placed This Encounter  Procedures  . CT CARDIAC SCORING (SELF PAY ONLY)  . Lipid Panel With LDL/HDL Ratio  . LDL cholesterol, direct  . CMP14+EGFR  . EKG 12-Lead  . PCV ECHOCARDIOGRAM COMPLETE  . PCV CAROTID DUPLEX (BILATERAL)    There are no Patient Instructions on file for this visit.   --Continue cardiac medications as reconciled in final medication list. --Return in about 4 weeks (around 01/24/2021) for Follow up HLD and , Review test results. Or sooner if needed. --Continue follow-up with your primary care physician regarding the management of your other chronic comorbid conditions.  Patient's questions and concerns were addressed to his satisfaction. He voices understanding of the instructions provided during this encounter.   This note was created using a voice recognition software as a result there may be grammatical errors inadvertently enclosed that do not reflect the nature of this encounter. Every attempt is made to correct such  errors.  Rex Kras, [place], [facility]  Pager: [phone] Office: [phone]

## 2020-12-27 ENCOUNTER — Ambulatory Visit: Payer: PPO | Admitting: Cardiology

## 2020-12-27 ENCOUNTER — Encounter: Payer: Self-pay | Admitting: Cardiology

## 2020-12-27 ENCOUNTER — Other Ambulatory Visit: Payer: Self-pay

## 2020-12-27 VITALS — BP 145/85 | HR 70 | Temp 98.0°F | Resp 16 | Ht 69.0 in | Wt 206.0 lb

## 2020-12-27 DIAGNOSIS — Z87891 Personal history of nicotine dependence: Secondary | ICD-10-CM

## 2020-12-27 DIAGNOSIS — E7800 Pure hypercholesterolemia, unspecified: Secondary | ICD-10-CM

## 2020-12-27 DIAGNOSIS — I6523 Occlusion and stenosis of bilateral carotid arteries: Secondary | ICD-10-CM

## 2020-12-27 DIAGNOSIS — I10 Essential (primary) hypertension: Secondary | ICD-10-CM

## 2020-12-27 DIAGNOSIS — Z8679 Personal history of other diseases of the circulatory system: Secondary | ICD-10-CM | POA: Diagnosis not present

## 2021-01-01 ENCOUNTER — Other Ambulatory Visit: Payer: Self-pay

## 2021-01-01 DIAGNOSIS — E7800 Pure hypercholesterolemia, unspecified: Secondary | ICD-10-CM

## 2021-01-16 ENCOUNTER — Inpatient Hospital Stay: Admission: RE | Admit: 2021-01-16 | Payer: PPO | Source: Ambulatory Visit

## 2021-01-16 ENCOUNTER — Other Ambulatory Visit: Payer: PPO

## 2021-01-23 ENCOUNTER — Other Ambulatory Visit: Payer: PPO

## 2021-01-30 ENCOUNTER — Other Ambulatory Visit: Payer: Self-pay

## 2021-01-30 ENCOUNTER — Ambulatory Visit: Payer: PPO

## 2021-01-30 DIAGNOSIS — I6523 Occlusion and stenosis of bilateral carotid arteries: Secondary | ICD-10-CM | POA: Diagnosis not present

## 2021-02-01 ENCOUNTER — Ambulatory Visit: Payer: PPO

## 2021-02-01 ENCOUNTER — Ambulatory Visit: Payer: PPO | Admitting: Cardiology

## 2021-02-01 ENCOUNTER — Other Ambulatory Visit: Payer: Self-pay

## 2021-02-01 DIAGNOSIS — I10 Essential (primary) hypertension: Secondary | ICD-10-CM | POA: Diagnosis not present

## 2021-02-05 ENCOUNTER — Ambulatory Visit
Admission: RE | Admit: 2021-02-05 | Discharge: 2021-02-05 | Disposition: A | Discharge status: Home / Self Care | Payer: PPO | Source: Ambulatory Visit | Attending: Cardiology | Admitting: Cardiology

## 2021-02-05 DIAGNOSIS — E7800 Pure hypercholesterolemia, unspecified: Secondary | ICD-10-CM

## 2021-02-05 DIAGNOSIS — I6523 Occlusion and stenosis of bilateral carotid arteries: Secondary | ICD-10-CM

## 2021-02-09 DIAGNOSIS — E7800 Pure hypercholesterolemia, unspecified: Secondary | ICD-10-CM | POA: Diagnosis not present

## 2021-02-10 LAB — LIPID PANEL WITH LDL/HDL RATIO
Cholesterol, Total: 185 mg/dL (ref 100–199)
HDL: 39 mg/dL — ABNORMAL LOW (ref >39)
LDL Chol Calc (NIH): 127 mg/dL — ABNORMAL HIGH (ref 0–99)
LDL/HDL Ratio: 3.3 ratio (ref 0.0–3.6)
Triglycerides: 101 mg/dL (ref 0–149)
VLDL Cholesterol Cal: 19 mg/dL (ref 5–40)

## 2021-02-10 LAB — CMP14+EGFR
ALT: 26 IU/L (ref 0–44)
AST: 33 IU/L (ref 0–40)
Albumin/Globulin Ratio: 2.3 — ABNORMAL HIGH (ref 1.2–2.2)
Albumin: 4.6 g/dL (ref 3.8–4.8)
Alkaline Phosphatase: 74 IU/L (ref 44–121)
BUN/Creatinine Ratio: 10 (ref 10–24)
BUN: 10 mg/dL (ref 8–27)
Bilirubin Total: 2.3 mg/dL — ABNORMAL HIGH (ref 0.0–1.2)
CO2: 25 mmol/L (ref 20–29)
Calcium: 9.3 mg/dL (ref 8.6–10.2)
Chloride: 100 mmol/L (ref 96–106)
Creatinine, Ser: 1.03 mg/dL (ref 0.76–1.27)
Globulin, Total: 2 g/dL (ref 1.5–4.5)
Glucose: 104 mg/dL — ABNORMAL HIGH (ref 65–99)
Potassium: 4.6 mmol/L (ref 3.5–5.2)
Sodium: 136 mmol/L (ref 134–144)
Total Protein: 6.6 g/dL (ref 6.0–8.5)
eGFR: 79 mL/min/{1.73_m2} (ref >59)

## 2021-02-10 LAB — LDL CHOLESTEROL, DIRECT: LDL Direct: 123 mg/dL — ABNORMAL HIGH (ref 0–99)

## 2021-02-12 NOTE — Progress Notes (Signed)
Called pt to inform him about his appt and lab results.

## 2021-02-13 ENCOUNTER — Other Ambulatory Visit: Payer: Self-pay

## 2021-02-13 ENCOUNTER — Ambulatory Visit: Payer: PPO | Admitting: Cardiology

## 2021-02-13 ENCOUNTER — Encounter: Payer: Self-pay | Admitting: Cardiology

## 2021-02-13 VITALS — BP 144/84 | HR 75 | Temp 97.6°F | Resp 16 | Ht 69.0 in | Wt 203.0 lb

## 2021-02-13 DIAGNOSIS — Z87891 Personal history of nicotine dependence: Secondary | ICD-10-CM

## 2021-02-13 DIAGNOSIS — E7800 Pure hypercholesterolemia, unspecified: Secondary | ICD-10-CM | POA: Diagnosis not present

## 2021-02-13 DIAGNOSIS — I2584 Coronary atherosclerosis due to calcified coronary lesion: Secondary | ICD-10-CM | POA: Diagnosis not present

## 2021-02-13 DIAGNOSIS — I2510 Atherosclerotic heart disease of native coronary artery without angina pectoris: Secondary | ICD-10-CM

## 2021-02-13 DIAGNOSIS — I77810 Thoracic aortic ectasia: Secondary | ICD-10-CM | POA: Diagnosis not present

## 2021-02-13 DIAGNOSIS — I10 Essential (primary) hypertension: Secondary | ICD-10-CM | POA: Diagnosis not present

## 2021-02-13 MED ORDER — ATORVASTATIN CALCIUM 80 MG PO TABS: 80.0000 mg | ORAL_TABLET | Freq: Every day | ORAL | 0 refills | Status: DC

## 2021-02-13 NOTE — Progress Notes (Signed)
Date:  02/13/2021   ID:  Pricilla Handler, DOB 1951/03/31, MRN [id number]  PCP:  Haywood Pao, MD  Cardiologist:  Rex Kras, DO, [facility] (established care 12/27/2020) Former Cardiology Providers: Dr. Wynonia Lawman  Date: 02/13/21 Last Office Visit: 12/27/2020  Chief Complaint  Patient presents with   Pure hypercholesterolemia   Results    Lipid profile and calcium score   Follow-up    HPI  Johnny Lopez is a 70 y.o. male who presents to the office with a chief complaint of " follow-up for management of coronary artery calcification and hyperlipidemia." Patient's past medical history and cardiovascular risk factors include: Hyperlipidemia, former smoker, carotid artery atherosclerosis, dilated ascending aorta, severe coronary artery calcification, retinal artery occlusion, ischemic optic neuropathy, hypertension, varicose veins, hypertensive heart disease, advanced age.   He is referred to the office at the request of Tisovec, Fransico Him, MD for evaluation of hypertensie heart diease without heart failure.  Patient works as a Games developer and lives a very active lifestyle but does not have any structured exercise program or daily routine that he follows.  He also states that his occupation is very busy and is unable to follow a heart healthy diet.  He was referred to cardiology for further evaluation and management.  Given his history of hypercholesterolemia at the last office visit recommended titration of Lipitor to 40 mg p.o. nightly and undergo a coronary calcium score for further risk stratification.  His  repeat lipid profile after up titration of statin therapy still notes an elevated LDL levels.  Patient's total coronary calcium score is 1593 placing him at the [place] percentile for age/sex/ethnicity matched cohorts.  In addition, patient had history of carotid artery atherosclerosis and was recommended to have a carotid duplex for reevaluation.  Results reviewed with him in great detail and  noted below for further reference.  Results of the echocardiogram reviewed with him in great detail and noted below for further reference.  ALLERGIES: No Known Allergies  MEDICATION LIST PRIOR TO VISIT: Current Meds  Medication Sig   aspirin 81 MG chewable tablet Chew by mouth daily.   atorvastatin (LIPITOR) 80 MG tablet Take 1 tablet (80 mg total) by mouth at bedtime.   Coenzyme Q10 (CO Q 10 PO) Take 100 mg by mouth daily. Liquid   Multiple Vitamin (MULTIVITAMIN) tablet Take 1 tablet by mouth daily.   Multiple Vitamins-Minerals (OCUVITE EYE HEALTH FORMULA PO) Take 1 capsule by mouth daily.   Omega-3 Fatty Acids (OMEGA-3 FISH OIL) 1200 MG CAPS Take 1,200 mg by mouth daily.   [DISCONTINUED] atorvastatin (LIPITOR) 40 MG tablet Take 40 mg by mouth at bedtime.   Current Facility-Administered Medications for the 02/13/21 encounter (Office Visit) with Terri Skains, Osmani Kersten, DO  Medication   0.9 %  sodium chloride infusion     PAST MEDICAL HISTORY: Past Medical History:  Diagnosis Date   Carotid artery disease ([place])    little plaque build up in the carotids   Coronary atherosclerosis due to calcified coronary lesion of native artery    Hyperlipidemia    on meds   Hypertension    past history -lost weight   Ischemic optic neuropathy    Retinal artery occlusion    Varicose veins of both lower extremities     PAST SURGICAL HISTORY: Past Surgical History:  Procedure Laterality Date   DENTAL SURGERY     TONSILLECTOMY  1962    FAMILY HISTORY: The patient family history includes Cancer in his father; Stroke (age of  onset: 21) in his mother.  SOCIAL HISTORY:  The patient  reports that he quit smoking about 26 years ago. His smoking use included cigarettes. He has a 37.50 pack-year smoking history. He has never used smokeless tobacco. He reports that he does not drink alcohol and does not use drugs.  REVIEW OF SYSTEMS: Review of Systems  Constitutional: Negative for chills and fever.   HENT:  Negative for hoarse voice and nosebleeds.   Eyes:  Negative for discharge, double vision and pain.  Cardiovascular:  Negative for chest pain, claudication, dyspnea on exertion, leg swelling, near-syncope, orthopnea, palpitations, paroxysmal nocturnal dyspnea and syncope.  Respiratory:  Negative for hemoptysis and shortness of breath.   Musculoskeletal:  Negative for muscle cramps and myalgias.  Gastrointestinal:  Negative for abdominal pain, constipation, diarrhea, hematemesis, hematochezia, melena, nausea and vomiting.  Neurological:  Negative for dizziness and light-headedness.   PHYSICAL EXAM: Vitals with BMI 02/13/2021 12/27/2020 12/05/2016  Height $Remov'5\' 9"'hrtyMD$  $Remove'5\' 9"'zXuBIpo$  -  Weight 203 lbs 206 lbs -  BMI 16.10 96.04 -  Systolic 540 981 191  Diastolic 84 85 73  Pulse 75 70 61    CONSTITUTIONAL: Well-developed and well-nourished. No acute distress.  SKIN: Skin is warm and dry. No rash noted. No cyanosis. No pallor. No jaundice HEAD: Normocephalic and atraumatic.  EYES: No scleral icterus MOUTH/THROAT: Moist oral membranes.  NECK: No JVD present. No thyromegaly noted. Right carotid bruits  LYMPHATIC: No visible cervical adenopathy.  CHEST Normal respiratory effort. No intercostal retractions  LUNGS: Clear to auscultation bilaterally.  No stridor. No wheezes. No rales.  CARDIOVASCULAR: Regular rate and rhythm, positive S1-S2, no murmurs rubs or gallops appreciated. ABDOMINAL: No apparent ascites.  EXTREMITIES: No peripheral edema  HEMATOLOGIC: No significant bruising NEUROLOGIC: Oriented to person, place, and time. Nonfocal. Normal muscle tone.  PSYCHIATRIC: Normal mood and affect. Normal behavior. Cooperative  CARDIAC DATABASE: EKG: 12/27/2020: NSR, 69bpm, PRWP, without underlying injury pattern.   Echocardiogram: 02/01/2021:  Normal LV systolic function with visual EF 55-60%. Left ventricle cavity is normal in size. Mild left ventricular hypertrophy. Normal global wall  motion.  Normal diastolic filling pattern, normal LAP.  Mild tricuspid regurgitation. No evidence of pulmonary hypertension. RVSP measures 31 mmHg.  IVC is normal with a respiratory response of <50%.  No prior study for comparison.  Coronary artery calcification scoring performed on 02/05/2021: Left main: 171 Left anterior descending: 625 Left circumflex: 670 Right coronary artery: 127 Total calcium score: 1593 AU, 92nd percentile Ascending aorta 40 mm-recommend annual imaging follow-up with CTA or MRA.   Stress Testing: No results found for this or any previous visit from the past 1095 days.  Heart Catheterization: None  Carotid artery duplex 01/30/2021:  Minimal plaque in the bilateral carotid arteries (heterogeneous plaque).  Antegrade right vertebral artery flow. Antegrade left vertebral artery  flow.  Follow up studies if clinically indicated.  LABORATORY DATA: CBC Latest Ref Rng & Units 04/15/2010  WBC 4.0 - 10.5 K/uL 4.7  Hemoglobin 13.0 - 17.0 g/dL 14.0  Hematocrit 39.0 - 52.0 % 39.2  Platelets 150 - 400 K/uL 187    CMP Latest Ref Rng & Units 02/09/2021 04/15/2010  Glucose 65 - 99 mg/dL 104(H) 119(H)  BUN 8 - 27 mg/dL 10 12  Creatinine 0.76 - 1.27 mg/dL 1.03 0.91  Sodium 134 - 144 mmol/L 136 139  Potassium 3.5 - 5.2 mmol/L 4.6 3.9  Chloride 96 - 106 mmol/L 100 105  CO2 20 - 29 mmol/L 25 28  Calcium  8.6 - 10.2 mg/dL 9.3 9.4  Total Protein 6.0 - 8.5 g/dL 6.6 -  Total Bilirubin 0.0 - 1.2 mg/dL 2.3(H) -  Alkaline Phos 44 - 121 IU/L 74 -  AST 0 - 40 IU/L 33 -  ALT 0 - 44 IU/L 26 -    Lipid Panel     Component Value Date/Time   CHOL 185 02/09/2021 0833   TRIG 101 02/09/2021 0833   HDL 39 (L) 02/09/2021 0833   LDLCALC 127 (H) 02/09/2021 0833   LDLDIRECT 123 (H) 02/09/2021 0834   LABVLDL 19 02/09/2021 0833    No components found for: NTPROBNP No results for input(s): PROBNP in the last 8760 hours. No results for input(s): TSH in the last 8760 hours.  BMP Recent Labs     02/09/21 0834  NA 136  K 4.6  CL 100  CO2 25  GLUCOSE 104*  BUN 10  CREATININE 1.03  CALCIUM 9.3    HEMOGLOBIN A1C No results found for: HGBA1C, MPG  External Labs: Collected: 11/15/2020 Hemoglobin 14.6 g/dL, hematocrit 44.6% Creatinine 0.9 mg/dL. eGFR: >60 mL/min per 1.73 m Lipid profile: Total cholesterol 183, triglycerides 72, HDL 43, LDL 126, non-HDL 140, apolipoprotein B 104 (normal 49-90)  IMPRESSION:    ICD-10-CM   1. Pure hypercholesterolemia  E78.00 Lipid Panel With LDL/HDL Ratio    LDL cholesterol, direct    CMP14+EGFR    2. Coronary atherosclerosis due to calcified coronary lesion  I25.10 PCV MYOCARDIAL PERFUSION WO LEXISCAN   I25.84 Lipid Panel With LDL/HDL Ratio    LDL cholesterol, direct    CMP14+EGFR    3. Ascending aorta dilatation (HCC)  I77.810     4. Primary hypertension  I10     5. Former smoker  Z87.891        RECOMMENDATIONS: Johnny Lopez is a 70 y.o. male whose past medical history and cardiac risk factors include: Hyperlipidemia, former smoker, carotid artery atherosclerosis, dilated ascending aorta, severe coronary artery calcification, retinal artery occlusion, ischemic optic neuropathy, hypertension, varicose veins, hypertensive heart disease, advanced age.   Hyperlipidemia, pure: Patient's cholesterol levels are not well controlled. LDL 126 followed by 123 mg/dL 6 weeks later. Increase Lipitor to 80 mg p.o. nightly. Educated on importance of reducing cholesterol rich foods. Repeat fasting lipid profile in 6 weeks.  Coronary atherosclerosis due to calcified coronary lesion: Continue aspirin 81 mg p.o. daily. Uptitrate statin therapy as discussed above. Educated on the importance of improving his modifiable cardiovascular risk factors. He denies angina pectoris/anginal equivalent.  However given his severe coronary artery calcification and multiple cardiovascular risk factors that shared decision is to proceed with stress test to  evaluate for reversible ischemia.  Ascending aorta dilatation: CC testing noted an ascending aorta of 40 mm. Educated on the importance of blood pressure management. Patient states that his home blood pressures are very well controlled. I have asked him to keep a log of his blood pressures and to review it with his PCP or to bring it in at the next office visit to see if medication is warranted. Will need repeat study in 1 year to reevaluate disease progression.  I would like to see him back in about 7 weeks to reevaluate his fasting lipid profile, blood pressure log, and assist him to improve his modifiable cardiovascular risk factors.  FINAL MEDICATION LIST END OF ENCOUNTER: Meds ordered this encounter  Medications   atorvastatin (LIPITOR) 80 MG tablet    Sig: Take 1 tablet (80 mg total) by mouth  at bedtime.    Dispense:  90 tablet    Refill:  0    Medications Discontinued During This Encounter  Medication Reason   atorvastatin (LIPITOR) 40 MG tablet Dose change     Current Outpatient Medications:    aspirin 81 MG chewable tablet, Chew by mouth daily., Disp: , Rfl:    atorvastatin (LIPITOR) 80 MG tablet, Take 1 tablet (80 mg total) by mouth at bedtime., Disp: 90 tablet, Rfl: 0   Coenzyme Q10 (CO Q 10 PO), Take 100 mg by mouth daily. Liquid, Disp: , Rfl:    Multiple Vitamin (MULTIVITAMIN) tablet, Take 1 tablet by mouth daily., Disp: , Rfl:    Multiple Vitamins-Minerals (OCUVITE EYE HEALTH FORMULA PO), Take 1 capsule by mouth daily., Disp: , Rfl:    Omega-3 Fatty Acids (OMEGA-3 FISH OIL) 1200 MG CAPS, Take 1,200 mg by mouth daily., Disp: , Rfl:   Current Facility-Administered Medications:    0.9 %  sodium chloride infusion, 500 mL, Intravenous, Continuous, Danis, Estill Cotta III, MD  Orders Placed This Encounter  Procedures   Lipid Panel With LDL/HDL Ratio   LDL cholesterol, direct   CMP14+EGFR   PCV MYOCARDIAL PERFUSION WO LEXISCAN    There are no Patient Instructions on file  for this visit.   --Continue cardiac medications as reconciled in final medication list. --Return in about 7 weeks (around 04/03/2021) for Follow up, Coronary artery calcification, Lipid. Or sooner if needed. --Continue follow-up with your primary care physician regarding the management of your other chronic comorbid conditions.  Patient's questions and concerns were addressed to his satisfaction. He voices understanding of the instructions provided during this encounter.   This note was created using a voice recognition software as a result there may be grammatical errors inadvertently enclosed that do not reflect the nature of this encounter. Every attempt is made to correct such errors.  Rex Kras, [place], [facility]  Pager: [phone] Office: [phone]

## 2021-03-21 ENCOUNTER — Ambulatory Visit: Payer: PPO

## 2021-03-21 ENCOUNTER — Other Ambulatory Visit: Payer: Self-pay

## 2021-03-21 DIAGNOSIS — I2584 Coronary atherosclerosis due to calcified coronary lesion: Secondary | ICD-10-CM

## 2021-03-21 DIAGNOSIS — I2510 Atherosclerotic heart disease of native coronary artery without angina pectoris: Secondary | ICD-10-CM

## 2021-03-29 NOTE — Progress Notes (Signed)
Called pt to inform him about his stress test. Pt understood

## 2021-04-02 DIAGNOSIS — E7800 Pure hypercholesterolemia, unspecified: Secondary | ICD-10-CM | POA: Diagnosis not present

## 2021-04-02 DIAGNOSIS — I2584 Coronary atherosclerosis due to calcified coronary lesion: Secondary | ICD-10-CM | POA: Diagnosis not present

## 2021-04-02 DIAGNOSIS — I2510 Atherosclerotic heart disease of native coronary artery without angina pectoris: Secondary | ICD-10-CM | POA: Diagnosis not present

## 2021-04-03 LAB — LIPID PANEL WITH LDL/HDL RATIO
Cholesterol, Total: 129 mg/dL (ref 100–199)
HDL: 35 mg/dL — ABNORMAL LOW (ref >39)
LDL Chol Calc (NIH): 74 mg/dL (ref 0–99)
LDL/HDL Ratio: 2.1 ratio (ref 0.0–3.6)
Triglycerides: 106 mg/dL (ref 0–149)
VLDL Cholesterol Cal: 20 mg/dL (ref 5–40)

## 2021-04-03 LAB — CMP14+EGFR
ALT: 22 IU/L (ref 0–44)
AST: 23 IU/L (ref 0–40)
Albumin/Globulin Ratio: 2 (ref 1.2–2.2)
Albumin: 4.2 g/dL (ref 3.8–4.8)
Alkaline Phosphatase: 69 IU/L (ref 44–121)
BUN/Creatinine Ratio: 8 — ABNORMAL LOW (ref 10–24)
BUN: 8 mg/dL (ref 8–27)
Bilirubin Total: 1.8 mg/dL — ABNORMAL HIGH (ref 0.0–1.2)
CO2: 23 mmol/L (ref 20–29)
Calcium: 9.2 mg/dL (ref 8.6–10.2)
Chloride: 106 mmol/L (ref 96–106)
Creatinine, Ser: 0.99 mg/dL (ref 0.76–1.27)
Globulin, Total: 2.1 g/dL (ref 1.5–4.5)
Glucose: 106 mg/dL — ABNORMAL HIGH (ref 65–99)
Potassium: 4.9 mmol/L (ref 3.5–5.2)
Sodium: 142 mmol/L (ref 134–144)
Total Protein: 6.3 g/dL (ref 6.0–8.5)
eGFR: 82 mL/min/{1.73_m2} (ref >59)

## 2021-04-03 LAB — LDL CHOLESTEROL, DIRECT: LDL Direct: 73 mg/dL (ref 0–99)

## 2021-04-04 NOTE — Progress Notes (Signed)
Tried calling patient --no answer

## 2021-04-05 ENCOUNTER — Encounter: Payer: Self-pay | Admitting: Cardiology

## 2021-04-05 ENCOUNTER — Other Ambulatory Visit: Payer: Self-pay

## 2021-04-05 ENCOUNTER — Ambulatory Visit: Payer: PPO | Admitting: Cardiology

## 2021-04-05 VITALS — BP 134/60 | HR 64 | Temp 98.0°F | Resp 16 | Ht 68.0 in | Wt 187.0 lb

## 2021-04-05 DIAGNOSIS — I2510 Atherosclerotic heart disease of native coronary artery without angina pectoris: Secondary | ICD-10-CM

## 2021-04-05 DIAGNOSIS — I10 Essential (primary) hypertension: Secondary | ICD-10-CM

## 2021-04-05 DIAGNOSIS — E7800 Pure hypercholesterolemia, unspecified: Secondary | ICD-10-CM | POA: Diagnosis not present

## 2021-04-05 DIAGNOSIS — I77810 Thoracic aortic ectasia: Secondary | ICD-10-CM | POA: Diagnosis not present

## 2021-04-05 DIAGNOSIS — Z87891 Personal history of nicotine dependence: Secondary | ICD-10-CM | POA: Diagnosis not present

## 2021-04-05 DIAGNOSIS — I2584 Coronary atherosclerosis due to calcified coronary lesion: Secondary | ICD-10-CM | POA: Diagnosis not present

## 2021-04-05 NOTE — Progress Notes (Signed)
ID:  Johnny Lopez, DOB 1951-02-24, MRN [id number]  PCP:  Gaspar Garbe, MD  Cardiologist:  Tessa Lerner, DO, [facility] (established care 12/27/2020) Former Cardiology Providers: Dr. Donnie Aho  Date: 04/05/21 Last Office Visit: 02/13/2021  Chief Complaint  Patient presents with   Coronary artery calcification   Hyperlipidemia   Follow-up    HPI  Johnny Lopez is a 70 y.o. male who presents to the office with a chief complaint of " follow-up for management of coronary artery calcification and hyperlipidemia." Patient's past medical history and cardiovascular risk factors include: Hyperlipidemia, former smoker, carotid artery atherosclerosis, dilated ascending aorta, severe coronary artery calcification, retinal artery occlusion, ischemic optic neuropathy, hypertension, varicose veins, hypertensive heart disease, advanced age.   He is referred to the office at the request of Tisovec, Adelfa Koh, MD for evaluation of hypertensie heart diease without heart failure.  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  He also states that his occupation is very busy and is unable to follow a heart healthy diet.  He was referred to cardiology for further evaluation and management.  Given his hypercholesterolemia and severe coronary artery calcification his statin therapy has been uptitrated in a stepwise fashion and at the last office visit his Lipitor was increased to 80 mg p.o. nightly.  In addition, he was recommended to undergo exercise stress test to evaluate for reversible ischemia given his degree of coronary calcification.  Most recent lipid profile reviewed 04/02/2021 which notes significant improvement in LDL compared to prior levels.  Exercise nuclear stress test notes good functional capacity for age and no convincing evidence of reversible myocardial ischemia.  Overall low risk study.  Since last  office visit he is doing well from a cardiovascular standpoint.  He denies any chest pain or shortness of breath at rest or with effort related activities.  No hospitalizations or urgent care visits for cardiovascular symptoms.  ALLERGIES: No Known Allergies  MEDICATION LIST PRIOR TO VISIT: Current Meds  Medication Sig   aspirin 81 MG chewable tablet Chew by mouth daily.   atorvastatin (LIPITOR) 80 MG tablet Take 1 tablet (80 mg total) by mouth at bedtime.   Coenzyme Q10 (CO Q 10 PO) Take 100 mg by mouth daily. Liquid   Multiple Vitamin (MULTIVITAMIN) tablet Take 1 tablet by mouth daily.   Multiple Vitamins-Minerals (OCUVITE EYE HEALTH FORMULA PO) Take 1 capsule by mouth daily.   Omega-3 Fatty Acids (OMEGA-3 FISH OIL) 1200 MG CAPS Take 1,200 mg by mouth daily.     PAST MEDICAL HISTORY: Past Medical History:  Diagnosis Date   Carotid artery disease (HCC)    little plaque build up in the carotids   Coronary atherosclerosis due to calcified coronary lesion of native artery    Hyperlipidemia    on meds   Hypertension    past history -lost weight   Ischemic optic neuropathy    Retinal artery occlusion    Varicose veins of both lower extremities     PAST SURGICAL HISTORY: Past Surgical History:  Procedure Laterality Date   DENTAL SURGERY     TONSILLECTOMY  1962    FAMILY HISTORY: The patient family history includes Cancer in his father; Stroke (age of onset: 10) in his mother.  SOCIAL HISTORY:  The patient  reports that he quit smoking about 26 years ago. His smoking use included cigarettes. He has a 37.50 pack-year smoking  history. He has never used smokeless tobacco. He reports that he does not drink alcohol and does not use drugs.  REVIEW OF SYSTEMS: Review of Systems  Constitutional: Negative for chills and fever.  HENT:  Negative for hoarse voice and nosebleeds.   Eyes:  Negative for discharge, double vision and pain.  Cardiovascular:  Negative for chest pain,  claudication, dyspnea on exertion, leg swelling, near-syncope, orthopnea, palpitations, paroxysmal nocturnal dyspnea and syncope.  Respiratory:  Negative for hemoptysis and shortness of breath.   Musculoskeletal:  Negative for muscle cramps and myalgias.  Gastrointestinal:  Negative for abdominal pain, constipation, diarrhea, hematemesis, hematochezia, melena, nausea and vomiting.  Neurological:  Negative for dizziness and light-headedness.   PHYSICAL EXAM: Vitals with BMI 04/05/2021 02/13/2021 12/27/2020  Height '5\' 8"'$  '5\' 9"'$  '5\' 9"'$   Weight 187 lbs 203 lbs 206 lbs  BMI 28.44 [id number]  Systolic [id number] 123456 [id number]  Diastolic 60 84 85  Pulse 64 75 70    CONSTITUTIONAL: Well-developed and well-nourished. No acute distress.  SKIN: Skin is warm and dry. No rash noted. No cyanosis. No pallor. No jaundice HEAD: Normocephalic and atraumatic.  EYES: No scleral icterus MOUTH/THROAT: Moist oral membranes.  NECK: No JVD present. No thyromegaly noted. Right carotid bruits  LYMPHATIC: No visible cervical adenopathy.  CHEST Normal respiratory effort. No intercostal retractions  LUNGS: Clear to auscultation bilaterally.  No stridor. No wheezes. No rales.  CARDIOVASCULAR: Regular rate and rhythm, positive S1-S2, no murmurs rubs or gallops appreciated. ABDOMINAL: No apparent ascites.  EXTREMITIES: No peripheral edema  HEMATOLOGIC: No significant bruising NEUROLOGIC: Oriented to person, place, and time. Nonfocal. Normal muscle tone.  PSYCHIATRIC: Normal mood and affect. Normal behavior. Cooperative  CARDIAC DATABASE: EKG: 12/27/2020: NSR, 69bpm, PRWP, without underlying injury pattern.   Echocardiogram: 02/01/2021:  Normal LV systolic function with visual EF 55-60%. Left ventricle cavity is normal in size. Mild left ventricular hypertrophy. Normal global wall  motion. Normal diastolic filling pattern, normal LAP.  Mild tricuspid regurgitation. No evidence of pulmonary hypertension. RVSP measures 31 mmHg.   IVC is normal with a respiratory response of <50%.  No prior study for comparison.  Coronary artery calcification scoring performed on 02/05/2021: Left main: 171 Left anterior descending: 625 Left circumflex: 670 Right coronary artery: 127 Total calcium score: 1593 AU, 92nd percentile Ascending aorta 40 mm-recommend annual imaging follow-up with CTA or MRA.   Stress Testing: Exercise Sestamibi stress test 03/21/2021: 1 Day Rest/Stress Protocol. Exercise time 7 minutes 29 seconds on Bruce protocol, achieved 9.34 METS, 98% of age-predicted maximum heart rate. Stress ECG negative for ischemia.  No convincing evidence of reversible myocardial ischemia or prior infarct.  Left ventricular ejection fraction is 56% with normal wall motion.  Low risk study.  Heart Catheterization: None  Carotid artery duplex 01/30/2021:  Minimal plaque in the bilateral carotid arteries (heterogeneous plaque).  Antegrade right vertebral artery flow. Antegrade left vertebral artery  flow.  Follow up studies if clinically indicated.  LABORATORY DATA: External Labs: Collected: 11/15/2020 Hemoglobin 14.6 g/dL, hematocrit 44.6% Creatinine 0.9 mg/dL. eGFR: >60 mL/min per 1.73 m Lipid profile: Total cholesterol 183, triglycerides 72, HDL 43, LDL 126, non-HDL 140, apolipoprotein B 104 (normal 49-90)  Lipid Panel     Component Value Date/Time   CHOL 129 04/02/2021 0808   TRIG 106 04/02/2021 0808   HDL 35 (L) 04/02/2021 0808   LDLCALC 74 04/02/2021 0808   LDLDIRECT 73 04/02/2021 0808   LABVLDL 20 04/02/2021 0808   BMP Recent Labs  02/09/21 0834 04/02/21 0808  NA 136 142  K 4.6 4.9  CL 100 106  CO2 25 23  GLUCOSE 104* 106*  BUN 10 8  CREATININE 1.03 0.99  CALCIUM 9.3 9.2   IMPRESSION:    ICD-10-CM   1. Pure hypercholesterolemia  E78.00     2. Coronary atherosclerosis due to calcified coronary lesion  I25.10    I25.84     3. Ascending aorta dilatation (HCC)  I77.810 PCV ECHOCARDIOGRAM  COMPLETE    4. Primary hypertension  I10     5. Former smoker  Z87.891        RECOMMENDATIONS: Johnny Lopez is a 70 y.o. male whose past medical history and cardiac risk factors include: Hyperlipidemia, former smoker, carotid artery atherosclerosis, dilated ascending aorta, severe coronary artery calcification, retinal artery occlusion, ischemic optic neuropathy, hypertension, varicose veins, hypertensive heart disease, advanced age.   Hyperlipidemia, pure: Improving LDL 126 followed by 123 mg/dL and now 73 mg/dL Continue the current dose of statin therapy. Does not endorse any myalgias or intolerances to statin therapy.  Coronary atherosclerosis due to calcified coronary lesion: Continue aspirin and statin therapy. Reviewed the results of the echocardiogram and stress test at today's office visit. No additional testing warranted at this time. Continue to monitor.  Ascending aorta dilatation: Coronary calcium report noted ascending aorta of 40 mm as of June 2022. Patient states that his home blood pressures are very well controlled with SBP between 945-038 mmHg and diastolic blood pressures between 80-85 mmHg. We will repeat an echocardiogram in June 2023 to reevaluate the dimensions of the ascending aorta.  If the dimensions are worsening we will proceed with a CT scan at that time.  As part of today's office visit reviewed the most recent labs from 04/02/2021 independently and management of hyperlipidemia discussed as noted above.  Also reviewed the nuclear stress test that was performed since last office visit.  His questions and concerns were addressed to his satisfaction.  FINAL MEDICATION LIST END OF ENCOUNTER: No orders of the defined types were placed in this encounter.   Medications Discontinued During This Encounter  Medication Reason   0.9 %  sodium chloride infusion       Current Outpatient Medications:    aspirin 81 MG chewable tablet, Chew by mouth daily., Disp: ,  Rfl:    atorvastatin (LIPITOR) 80 MG tablet, Take 1 tablet (80 mg total) by mouth at bedtime., Disp: 90 tablet, Rfl: 0   Coenzyme Q10 (CO Q 10 PO), Take 100 mg by mouth daily. Liquid, Disp: , Rfl:    Multiple Vitamin (MULTIVITAMIN) tablet, Take 1 tablet by mouth daily., Disp: , Rfl:    Multiple Vitamins-Minerals (OCUVITE EYE HEALTH FORMULA PO), Take 1 capsule by mouth daily., Disp: , Rfl:    Omega-3 Fatty Acids (OMEGA-3 FISH OIL) 1200 MG CAPS, Take 1,200 mg by mouth daily., Disp: , Rfl:    sildenafil (REVATIO) 20 MG tablet, Take 1 tablet by mouth as needed., Disp: , Rfl:   Orders Placed This Encounter  Procedures   PCV ECHOCARDIOGRAM COMPLETE   There are no Patient Instructions on file for this visit.   --Continue cardiac medications as reconciled in final medication list. --Return in about 11 months (around 03/05/2022) for Follow up , Coronary artery calcification, Aorta Dilatation. Or sooner if needed. --Continue follow-up with your primary care physician regarding the management of your other chronic comorbid conditions.  Patient's questions and concerns were addressed to his satisfaction. He voices understanding of  the instructions provided during this encounter.   This note was created using a voice recognition software as a result there may be grammatical errors inadvertently enclosed that do not reflect the nature of this encounter. Every attempt is made to correct such errors.  Rex Kras, [place], [facility]  Pager: [phone] Office: [phone]

## 2021-04-06 NOTE — Progress Notes (Signed)
Patient came in for an office visit results were discuss in visit

## 2021-05-21 ENCOUNTER — Other Ambulatory Visit: Payer: Self-pay | Admitting: Cardiology

## 2021-10-26 ENCOUNTER — Other Ambulatory Visit: Payer: Self-pay | Admitting: Cardiology

## 2021-12-03 DIAGNOSIS — I119 Hypertensive heart disease without heart failure: Secondary | ICD-10-CM | POA: Diagnosis not present

## 2021-12-03 DIAGNOSIS — E7800 Pure hypercholesterolemia, unspecified: Secondary | ICD-10-CM | POA: Diagnosis not present

## 2021-12-03 DIAGNOSIS — Z125 Encounter for screening for malignant neoplasm of prostate: Secondary | ICD-10-CM | POA: Diagnosis not present

## 2021-12-05 DIAGNOSIS — E7800 Pure hypercholesterolemia, unspecified: Secondary | ICD-10-CM | POA: Diagnosis not present

## 2021-12-05 DIAGNOSIS — R7989 Other specified abnormal findings of blood chemistry: Secondary | ICD-10-CM | POA: Diagnosis not present

## 2021-12-10 DIAGNOSIS — E663 Overweight: Secondary | ICD-10-CM | POA: Diagnosis not present

## 2021-12-10 DIAGNOSIS — F5221 Male erectile disorder: Secondary | ICD-10-CM | POA: Diagnosis not present

## 2021-12-10 DIAGNOSIS — Z1331 Encounter for screening for depression: Secondary | ICD-10-CM | POA: Diagnosis not present

## 2021-12-10 DIAGNOSIS — E7800 Pure hypercholesterolemia, unspecified: Secondary | ICD-10-CM | POA: Diagnosis not present

## 2021-12-10 DIAGNOSIS — R82998 Other abnormal findings in urine: Secondary | ICD-10-CM | POA: Diagnosis not present

## 2021-12-10 DIAGNOSIS — L570 Actinic keratosis: Secondary | ICD-10-CM | POA: Diagnosis not present

## 2021-12-10 DIAGNOSIS — I519 Heart disease, unspecified: Secondary | ICD-10-CM | POA: Diagnosis not present

## 2021-12-10 DIAGNOSIS — Z0000 Encounter for general adult medical examination without abnormal findings: Secondary | ICD-10-CM | POA: Diagnosis not present

## 2021-12-10 DIAGNOSIS — D692 Other nonthrombocytopenic purpura: Secondary | ICD-10-CM | POA: Diagnosis not present

## 2021-12-10 DIAGNOSIS — I119 Hypertensive heart disease without heart failure: Secondary | ICD-10-CM | POA: Diagnosis not present

## 2021-12-10 DIAGNOSIS — Z1339 Encounter for screening examination for other mental health and behavioral disorders: Secondary | ICD-10-CM | POA: Diagnosis not present

## 2021-12-10 DIAGNOSIS — Z23 Encounter for immunization: Secondary | ICD-10-CM | POA: Diagnosis not present

## 2022-02-18 ENCOUNTER — Other Ambulatory Visit: Payer: PPO

## 2022-03-06 ENCOUNTER — Ambulatory Visit: Payer: PPO | Admitting: Cardiology

## 2022-05-22 ENCOUNTER — Other Ambulatory Visit: Payer: Self-pay | Admitting: Cardiology

## 2022-06-07 DIAGNOSIS — H348321 Tributary (branch) retinal vein occlusion, left eye, with retinal neovascularization: Secondary | ICD-10-CM | POA: Diagnosis not present

## 2022-06-07 DIAGNOSIS — H34232 Retinal artery branch occlusion, left eye: Secondary | ICD-10-CM | POA: Diagnosis not present

## 2022-06-07 DIAGNOSIS — H3562 Retinal hemorrhage, left eye: Secondary | ICD-10-CM | POA: Diagnosis not present

## 2022-06-07 DIAGNOSIS — H43813 Vitreous degeneration, bilateral: Secondary | ICD-10-CM | POA: Diagnosis not present

## 2022-06-21 DIAGNOSIS — H348321 Tributary (branch) retinal vein occlusion, left eye, with retinal neovascularization: Secondary | ICD-10-CM | POA: Diagnosis not present

## 2022-06-24 ENCOUNTER — Other Ambulatory Visit: Payer: Self-pay

## 2022-06-24 ENCOUNTER — Encounter (HOSPITAL_COMMUNITY): Payer: Self-pay

## 2022-06-24 ENCOUNTER — Emergency Department (HOSPITAL_BASED_OUTPATIENT_CLINIC_OR_DEPARTMENT_OTHER): Payer: PPO

## 2022-06-24 ENCOUNTER — Emergency Department (HOSPITAL_COMMUNITY)
Admission: EM | Admit: 2022-06-24 | Discharge: 2022-06-24 | Discharge status: Against Medical Advice | Payer: PPO | Attending: Emergency Medicine | Admitting: Emergency Medicine

## 2022-06-24 ENCOUNTER — Encounter (HOSPITAL_BASED_OUTPATIENT_CLINIC_OR_DEPARTMENT_OTHER): Payer: Self-pay

## 2022-06-24 ENCOUNTER — Emergency Department (HOSPITAL_BASED_OUTPATIENT_CLINIC_OR_DEPARTMENT_OTHER)
Admission: EM | Admit: 2022-06-24 | Discharge: 2022-06-24 | Disposition: A | Discharge status: Home / Self Care | Payer: PPO | Source: Home / Self Care | Attending: Emergency Medicine | Admitting: Emergency Medicine

## 2022-06-24 ENCOUNTER — Emergency Department (HOSPITAL_COMMUNITY): Payer: PPO

## 2022-06-24 DIAGNOSIS — I10 Essential (primary) hypertension: Secondary | ICD-10-CM | POA: Insufficient documentation

## 2022-06-24 DIAGNOSIS — R000 Tachycardia, unspecified: Secondary | ICD-10-CM | POA: Diagnosis not present

## 2022-06-24 DIAGNOSIS — R0602 Shortness of breath: Secondary | ICD-10-CM | POA: Insufficient documentation

## 2022-06-24 DIAGNOSIS — R42 Dizziness and giddiness: Secondary | ICD-10-CM | POA: Insufficient documentation

## 2022-06-24 DIAGNOSIS — Z7982 Long term (current) use of aspirin: Secondary | ICD-10-CM | POA: Insufficient documentation

## 2022-06-24 DIAGNOSIS — R002 Palpitations: Secondary | ICD-10-CM | POA: Insufficient documentation

## 2022-06-24 DIAGNOSIS — Z5321 Procedure and treatment not carried out due to patient leaving prior to being seen by health care provider: Secondary | ICD-10-CM | POA: Diagnosis not present

## 2022-06-24 DIAGNOSIS — Z79899 Other long term (current) drug therapy: Secondary | ICD-10-CM | POA: Insufficient documentation

## 2022-06-24 DIAGNOSIS — R55 Syncope and collapse: Secondary | ICD-10-CM | POA: Diagnosis not present

## 2022-06-24 DIAGNOSIS — R079 Chest pain, unspecified: Secondary | ICD-10-CM | POA: Diagnosis not present

## 2022-06-24 DIAGNOSIS — M79662 Pain in left lower leg: Secondary | ICD-10-CM | POA: Insufficient documentation

## 2022-06-24 DIAGNOSIS — I2510 Atherosclerotic heart disease of native coronary artery without angina pectoris: Secondary | ICD-10-CM | POA: Insufficient documentation

## 2022-06-24 DIAGNOSIS — M79605 Pain in left leg: Secondary | ICD-10-CM | POA: Diagnosis not present

## 2022-06-24 LAB — BASIC METABOLIC PANEL
Anion gap: 13 (ref 5–15)
BUN: 8 mg/dL (ref 8–23)
CO2: 24 mmol/L (ref 22–32)
Calcium: 9.7 mg/dL (ref 8.9–10.3)
Chloride: 101 mmol/L (ref 98–111)
Creatinine, Ser: 1.05 mg/dL (ref 0.61–1.24)
GFR, Estimated: >60 mL/min (ref >60)
Glucose, Bld: 103 mg/dL — ABNORMAL HIGH (ref 70–99)
Potassium: 4 mmol/L (ref 3.5–5.1)
Sodium: 138 mmol/L (ref 135–145)

## 2022-06-24 LAB — TSH: TSH: 2.576 u[IU]/mL (ref 0.350–4.500)

## 2022-06-24 LAB — CBC WITH DIFFERENTIAL/PLATELET
Abs Immature Granulocytes: 0.01 10*3/uL (ref 0.00–0.07)
Basophils Absolute: 0 10*3/uL (ref 0.0–0.1)
Basophils Relative: 1 %
Eosinophils Absolute: 0 10*3/uL (ref 0.0–0.5)
Eosinophils Relative: 1 %
HCT: 45.8 % (ref 39.0–52.0)
Hemoglobin: 15.1 g/dL (ref 13.0–17.0)
Immature Granulocytes: 0 %
Lymphocytes Relative: 20 %
Lymphs Abs: 1.2 10*3/uL (ref 0.7–4.0)
MCH: 29.8 pg (ref 26.0–34.0)
MCHC: 33 g/dL (ref 30.0–36.0)
MCV: 90.3 fL (ref 80.0–100.0)
Monocytes Absolute: 0.5 10*3/uL (ref 0.1–1.0)
Monocytes Relative: 9 %
Neutro Abs: 4.1 10*3/uL (ref 1.7–7.7)
Neutrophils Relative %: 69 %
Platelets: 247 10*3/uL (ref 150–400)
RBC: 5.07 MIL/uL (ref 4.22–5.81)
RDW: 13.6 % (ref 11.5–15.5)
WBC: 5.8 10*3/uL (ref 4.0–10.5)
nRBC: 0 % (ref 0.0–0.2)

## 2022-06-24 LAB — TROPONIN I (HIGH SENSITIVITY)
Troponin I (High Sensitivity): 6 ng/L (ref <18)
Troponin I (High Sensitivity): 4 ng/L (ref <18)

## 2022-06-24 LAB — MAGNESIUM: Magnesium: 1.9 mg/dL (ref 1.7–2.4)

## 2022-06-24 LAB — D-DIMER, QUANTITATIVE: D-Dimer, Quant: 0.55 ug/mL-FEU — ABNORMAL HIGH (ref 0.00–0.50)

## 2022-06-24 LAB — BRAIN NATRIURETIC PEPTIDE: B Natriuretic Peptide: 11 pg/mL (ref 0.0–100.0)

## 2022-06-24 MED ORDER — LACTATED RINGERS IV BOLUS
1000.0000 mL | Freq: Once | INTRAVENOUS | Status: AC
  Administered 2022-06-24: 1000 mL via INTRAVENOUS

## 2022-06-24 NOTE — ED Provider Triage Note (Signed)
Emergency Medicine Provider Triage Evaluation Note  Ezariah Nace , a 71 y.o. male  was evaluated in triage.  Pt complains of racing heart and near syncope.  Symptoms have been intermittent for the past few days.  Distant history of smoking.  Known coronary atherosclerosis and peripheral vascular disease.  No active chest pain at this time  Review of Systems  Positive: Racing heart Negative: Chest pain  Physical Exam  BP (!) 158/112 (BP Location: Right Arm)   Pulse 81   Temp 97.7 F (36.5 C) (Oral)   Resp 18   SpO2 100%  Gen:   Awake, no distress   Resp:  Normal effort  MSK:   Moves extremities without difficulty  Other:  Regular rate and rhythm  Medical Decision Making  Medically screening exam initiated at 10:11 AM.  Appropriate orders placed.  Jahmire Ruffins was informed that the remainder of the evaluation will be completed by another provider, this initial triage assessment does not replace that evaluation, and the importance of remaining in the ED until their evaluation is complete.  Work up [employer], [place], PA-C 06/24/22 1012

## 2022-06-24 NOTE — ED Notes (Signed)
Pt c/o no dizziness at the moment. Also, no SOB. VSS. A/ox4

## 2022-06-24 NOTE — ED Triage Notes (Signed)
Patient complains of intermittent chest pain, dizziness and HTN the past week. Patient alert and oriented. No hx of cardiac problems.

## 2022-06-24 NOTE — ED Provider Notes (Signed)
[facility] EMERGENCY DEPT Provider Note   CSN: [id number] Arrival date & time: 06/24/22  1426     History {Add pertinent medical, surgical, social history, OB history to HPI:1} Chief Complaint  Patient presents with   Shortness of Breath   Dizziness    Johnny Lopez is a 71 y.o. male.   Shortness of Breath Dizziness Associated symptoms: shortness of breath        71 y.o. male whose past medical history and cardiac risk factors include: Hyperlipidemia, former smoker, carotid artery atherosclerosis, dilated ascending aorta, severe coronary artery calcification, retinal artery occlusion, ischemic optic neuropathy, hypertension, varicose veins, hypertensive heart disease  Home Medications Prior to Admission medications   Medication Sig Start Date End Date Taking? Authorizing Provider  aspirin 81 MG chewable tablet Chew by mouth daily.    [provider]  atorvastatin (LIPITOR) 80 MG tablet TAKE 1 TABLET(80 MG) BY MOUTH AT BEDTIME 05/22/22   Tolia, Sunit, DO  Coenzyme Q10 (CO Q 10 PO) Take 100 mg by mouth daily. Liquid    [provider]  Multiple Vitamin (MULTIVITAMIN) tablet Take 1 tablet by mouth daily.    [provider]  Multiple Vitamins-Minerals (OCUVITE EYE HEALTH FORMULA PO) Take 1 capsule by mouth daily.    [provider]  Omega-3 Fatty Acids (OMEGA-3 FISH OIL) 1200 MG CAPS Take 1,200 mg by mouth daily.    [provider]  sildenafil (REVATIO) 20 MG tablet Take 1 tablet by mouth as needed. 03/24/21   [provider]      Allergies    Patient has no known allergies.    Review of Systems   Review of Systems  Respiratory:  Positive for shortness of breath.   Neurological:  Positive for dizziness.    Physical Exam Updated Vital Signs BP 139/87   Pulse (!) 59   Temp 98.5 F (36.9 C) (Tympanic)   Resp 10   Ht '5\' 9"'$  (1.753 m)   Wt 79.4 kg   SpO2 98%   BMI 25.84 kg/m  Physical Exam  ED  Results / Procedures / Treatments   Labs (all labs ordered are listed, but only abnormal results are displayed) Labs Reviewed - No data to display  EKG None  Radiology DG Chest 2 View  Result Date: 06/24/2022 CLINICAL DATA:  71 year old male with near syncope. Intermittent chest pain and dizziness. EXAM: CHEST - 2 VIEW COMPARISON:  Cardiac CT 02/05/2021 and earlier. FINDINGS: Lung volumes and mediastinal contours are stable since 2011. Borderline hyperinflation and tortuous thoracic aorta. No cardiomegaly. Visualized tracheal air column is within normal limits. No pneumothorax, pulmonary edema, pleural effusion, or acute pulmonary opacity. No acute osseous abnormality identified. Negative visible bowel gas. IMPRESSION: No acute cardiopulmonary abnormality. Suspicion of chronic pulmonary hyperinflation. Tortuous thoracic aorta. Electronically Signed   By: Genevie Ann M.D.   On: 06/24/2022 10:35    Procedures Procedures  {Document cardiac monitor, telemetry assessment procedure when appropriate:1}  Medications Ordered in ED Medications - No data to display  ED Course/ Medical Decision Making/ A&P                           Medical Decision Making Amount and/or Complexity of Data Reviewed Labs: ordered. Radiology: ordered.   ***  {Document critical care time when appropriate:1} {Document review of labs and clinical decision tools ie heart score, Chads2Vasc2 etc:1}  {Document your independent review of radiology images, and any outside records:1} {Document  your discussion with family members, caretakers, and with consultants:1} {Document social determinants of health affecting pt's care:1} {Document your decision making why or why not admission, treatments were needed:1} Final Clinical Impression(s) / ED Diagnoses Final diagnoses:  None    Rx / DC Orders ED Discharge Orders     None

## 2022-06-24 NOTE — ED Notes (Signed)
Pt left due to PCP told him to go to [place] being he will be seen quicker.

## 2022-06-24 NOTE — ED Notes (Signed)
Patient denies pain and is resting comfortably.  

## 2022-06-24 NOTE — Discharge Instructions (Addendum)
Please follow-up with your cardiologist tomorrow as scheduled.   Your laboratory testing, extremity imaging and EKG were reassuring today.  Your symptoms improved following IV fluids.

## 2022-06-24 NOTE — ED Triage Notes (Addendum)
Pt c/o dizziness and SHOB started today. Denies any falls Had laser eye sx 06/22/22 Seeing spots in both eyes Pt was seen at [facility] and [facility] due to wait time  Had blood work this am at [facility]

## 2022-06-25 ENCOUNTER — Ambulatory Visit: Payer: PPO | Admitting: Cardiology

## 2022-06-25 ENCOUNTER — Encounter: Payer: Self-pay | Admitting: Cardiology

## 2022-06-25 VITALS — BP 145/84 | HR 66 | Temp 97.5°F | Resp 16 | Ht 69.0 in | Wt 184.2 lb

## 2022-06-25 DIAGNOSIS — E7800 Pure hypercholesterolemia, unspecified: Secondary | ICD-10-CM | POA: Diagnosis not present

## 2022-06-25 DIAGNOSIS — R42 Dizziness and giddiness: Secondary | ICD-10-CM

## 2022-06-25 DIAGNOSIS — I2510 Atherosclerotic heart disease of native coronary artery without angina pectoris: Secondary | ICD-10-CM

## 2022-06-25 DIAGNOSIS — I10 Essential (primary) hypertension: Secondary | ICD-10-CM

## 2022-06-25 DIAGNOSIS — Z87891 Personal history of nicotine dependence: Secondary | ICD-10-CM | POA: Diagnosis not present

## 2022-06-25 DIAGNOSIS — I77810 Thoracic aortic ectasia: Secondary | ICD-10-CM

## 2022-06-25 DIAGNOSIS — R072 Precordial pain: Secondary | ICD-10-CM | POA: Diagnosis not present

## 2022-06-25 DIAGNOSIS — I2584 Coronary atherosclerosis due to calcified coronary lesion: Secondary | ICD-10-CM | POA: Diagnosis not present

## 2022-06-25 NOTE — Progress Notes (Signed)
ID:  Johnny Lopez, DOB [birth year], MRN [id number]  PCP:  Haywood Pao, MD  Cardiologist:  Rex Kras, DO, [facility] (established care 12/27/2020) Former Cardiology Providers: Dr. Wynonia Lawman  Date: 06/25/22 Last Office Visit: 04/05/2021  Chief Complaint  Patient presents with   Hospitalization Follow-up   Dizziness    HPI  Johnny Lopez is a 71 y.o. male whose past medical history and cardiovascular risk factors include: Hyperlipidemia, former smoker, carotid artery atherosclerosis, dilated ascending aorta, severe coronary artery calcification, retinal artery occlusion, ischemic optic neuropathy, hypertension, varicose veins, advanced age.   Patient was referred to the practice for evaluation of heart disease.  He was noted to have hypercholesterolemia and severe coronary artery calcification.  He underwent ischemic work-up as outlined below and presents today for sick visit.  Yesterday he went to the ED due to symptoms of dizziness.  Patient states that he was told that this likely secondary to dehydration.  He was given IV fluids, EKG in the ED was nonischemic, renal function at baseline, high sensitive troponins negative x2, BNP within normal limits as well.  He was experiencing dizziness for at least 1 week, occurring daily, no improving or worsening factors, short-lived, no near-syncope or syncope.  He had laser procedure to his left eye on 06/21/2022 and 2 days later on 06/23/2022 had an episode of 20 minutes of vision change which he describes as "seeing zigzag lines."  He has not followed up with his ophthalmologist.  He has not some nonspecific precordial pain, describes it as a light twitch like sensation, nonexertional, does not resolve with rest, self-limited.  ALLERGIES: No Known Allergies  MEDICATION LIST PRIOR TO VISIT: Current Meds  Medication Sig   aspirin 81 MG chewable tablet Chew by mouth daily.   atorvastatin (LIPITOR) 80 MG tablet TAKE 1 TABLET(80 MG) BY MOUTH AT  BEDTIME   Coenzyme Q10 (CO Q 10 PO) Take 100 mg by mouth daily. Liquid   Omega-3 Fatty Acids (OMEGA-3 FISH OIL) 1200 MG CAPS Take 1,200 mg by mouth daily.   sildenafil (REVATIO) 20 MG tablet Take 1 tablet by mouth as needed.     PAST MEDICAL HISTORY: Past Medical History:  Diagnosis Date   Carotid artery disease ([place])    little plaque build up in the carotids   Coronary atherosclerosis due to calcified coronary lesion of native artery    Hyperlipidemia    on meds   Hypertension    past history -lost weight   Ischemic optic neuropathy    Retinal artery occlusion    Varicose veins of both lower extremities     PAST SURGICAL HISTORY: Past Surgical History:  Procedure Laterality Date   DENTAL SURGERY     EYE SURGERY     TONSILLECTOMY  1962    FAMILY HISTORY: The patient family history includes Cancer in his father; Stroke (age of onset: 15) in his mother.  SOCIAL HISTORY:  The patient  reports that he quit smoking about 27 years ago. His smoking use included cigarettes. He has a 37.50 pack-year smoking history. He has never used smokeless tobacco. He reports that he does not drink alcohol and does not use drugs.  REVIEW OF SYSTEMS: Review of Systems  Constitutional: Negative for chills and fever.  HENT:  Negative for hoarse voice and nosebleeds.   Eyes:  Negative for discharge, double vision and pain.  Cardiovascular:  Negative for chest pain, claudication, dyspnea on exertion, leg swelling, near-syncope, orthopnea, palpitations, paroxysmal nocturnal dyspnea and syncope.  Respiratory:  Negative for hemoptysis and shortness of breath.   Musculoskeletal:  Negative for muscle cramps and myalgias.  Gastrointestinal:  Negative for abdominal pain, constipation, diarrhea, hematemesis, hematochezia, melena, nausea and vomiting.  Neurological:  Positive for dizziness (Improving). Negative for light-headedness.    PHYSICAL EXAM:    06/25/2022    3:14 PM 06/24/2022    7:45 PM  06/24/2022    7:15 PM  Vitals with BMI  Height _0     Weight 184 lbs 3 oz    BMI 91.66    Systolic 060 045 997  Diastolic 84 83 87  Pulse 66 56    Orthostatic VS for the past 72 hrs (Last 3 readings):  Orthostatic BP Patient Position BP Location Cuff Size Orthostatic Pulse  06/25/22 1522 139/83 Standing Left Arm Large 71  06/25/22 1520 136/81 Sitting Left Arm Large 64  06/25/22 1519 136/74 Supine Left Arm Large 64   Physical Exam  Constitutional: No distress.  Age appropriate, hemodynamically stable.   Neck: No JVD present.  Cardiovascular: Normal rate, regular rhythm, S1 normal, S2 normal, intact distal pulses and normal pulses. Exam reveals no gallop, no S3 and no S4.  No murmur heard. Pulses:      Carotid pulses are  on the right side with bruit. Pulmonary/Chest: Effort normal and breath sounds normal. No stridor. He has no wheezes. He has no rales.  Abdominal: Soft. Bowel sounds are normal. He exhibits no distension. There is no abdominal tenderness.  Musculoskeletal:        General: No edema.     Cervical back: Neck supple.  Neurological: He is alert and oriented to person, place, and time. He has intact cranial nerves (2-12).  Skin: Skin is warm and moist.     CARDIAC DATABASE: EKG: 06/25/2022: Sinus bradycardia, 59 bpm, normal axis, without underlying ischemia injury pattern.  Echocardiogram: 02/01/2021:  Normal LV systolic function with visual EF 55-60%. Left ventricle cavity is normal in size. Mild left ventricular hypertrophy. Normal global wall  motion. Normal diastolic filling pattern, normal LAP.  Mild tricuspid regurgitation. No evidence of pulmonary hypertension. RVSP measures 31 mmHg.  IVC is normal with a respiratory response of <50%.  No prior study for comparison.  Coronary artery calcification scoring performed on 02/05/2021: Left main: 171 Left anterior descending: 625 Left circumflex: 670 Right coronary artery: 127 Total calcium score: 1593 AU,  92nd percentile Ascending aorta 40 mm-recommend annual imaging follow-up with CTA or MRA.   Stress Testing: Exercise Sestamibi stress test 03/21/2021: 1 Day Rest/Stress Protocol. Exercise time 7 minutes 29 seconds on Bruce protocol, achieved 9.34 METS, 98% of age-predicted maximum heart rate. Stress ECG negative for ischemia.  No convincing evidence of reversible myocardial ischemia or prior infarct.  Left ventricular ejection fraction is 56% with normal wall motion.  Low risk study.  Heart Catheterization: None  Carotid artery duplex 01/30/2021:  Minimal plaque in the bilateral carotid arteries (heterogeneous plaque).  Antegrade right vertebral artery flow. Antegrade left vertebral artery  flow.  Follow up studies if clinically indicated.  LABORATORY DATA: External Labs: Collected: 11/15/2020 Hemoglobin 14.6 g/dL, hematocrit 44.6% Creatinine 0.9 mg/dL. eGFR: >60 mL/min per 1.73 m Lipid profile: Total cholesterol 183, triglycerides 72, HDL 43, LDL 126, non-HDL 140, apolipoprotein B 104 (normal 49-90)  Lipid Panel     Component Value Date/Time   CHOL 129 04/02/2021 0808   TRIG 106 04/02/2021 0808   HDL 35 (L) 04/02/2021 0808   LDLCALC 74 04/02/2021 7414  LDLDIRECT 73 04/02/2021 0808   LABVLDL 20 04/02/2021 0808   BMP Recent Labs    06/24/22 1014  NA 138  K 4.0  CL 101  CO2 24  GLUCOSE 103*  BUN 8  CREATININE 1.05  CALCIUM 9.7  GFRNONAA >60   IMPRESSION:    ICD-10-CM   1. Dizziness  R42 EKG 12-Lead    2. Precordial pain  R07.2 PCV ECHOCARDIOGRAM COMPLETE    PCV CARDIAC STRESS TEST    3. Pure hypercholesterolemia  E78.00     4. Coronary atherosclerosis due to calcified coronary lesion  I25.10    I25.84     5. Ascending aorta dilatation (HCC)  I77.810     6. Primary hypertension  I10     7. Former smoker  Z87.891        RECOMMENDATIONS: Arjuna Doeden is a 71 y.o. male whose past medical history and cardiac risk factors include: Hyperlipidemia,  former smoker, carotid artery atherosclerosis, dilated ascending aorta, severe coronary artery calcification, retinal artery occlusion, ischemic optic neuropathy, hypertension, varicose veins, hypertensive heart disease, advanced age.   Dizziness Improving. Orthostatic vital signs negative. No documented episodes of hypotension to explain his dizziness. No associated nausea, vomiting, or gait imbalance-patient is made aware that if these symptoms occur in the setting of dizziness he should go to the closest ER via EMS as these are symptoms of stroke. I have asked him to discuss noncardiac causes of dizziness with his PCP. Monitor for now  Precordial pain Likely noncardiac. Last ischemic work-up in 2022 Plan echo and GXT  Pure hypercholesterolemia Improving Placed on statin therapy given his indexed LDL and severe coronary artery calcification. Monitor for now  Coronary atherosclerosis due to calcified coronary lesion Continue aspirin and statin therapy. Reemphasized importance of secondary prevention  Ascending aorta dilatation (HCC) Coronary calcium report noted ascending aorta of 40 mm as of June 2022. We will order an echocardiogram to reevaluate the dimensions of the ascending aorta.  If the dimensions are worsening we will proceed with a CT scan at that time.  Primary hypertension Office blood pressure not well controlled. I have asked him to keep a log of his blood pressures at home and to review with PCP to see if medication needs to be initiated. Reemphasized importance of a low-salt diet. Since he is having episodes of dizziness will avoid adding antihypertensive medications at today's office visit.  Former smoker Educated on the importance of continued smoking cessation.  FINAL MEDICATION LIST END OF ENCOUNTER: No orders of the defined types were placed in this encounter.   Medications Discontinued During This Encounter  Medication Reason   Multiple Vitamin  (MULTIVITAMIN) tablet    Multiple Vitamins-Minerals (OCUVITE EYE HEALTH FORMULA PO)       Current Outpatient Medications:    aspirin 81 MG chewable tablet, Chew by mouth daily., Disp: , Rfl:    atorvastatin (LIPITOR) 80 MG tablet, TAKE 1 TABLET(80 MG) BY MOUTH AT BEDTIME, Disp: 90 tablet, Rfl: 0   Coenzyme Q10 (CO Q 10 PO), Take 100 mg by mouth daily. Liquid, Disp: , Rfl:    Omega-3 Fatty Acids (OMEGA-3 FISH OIL) 1200 MG CAPS, Take 1,200 mg by mouth daily., Disp: , Rfl:    sildenafil (REVATIO) 20 MG tablet, Take 1 tablet by mouth as needed., Disp: , Rfl:   Orders Placed This Encounter  Procedures   PCV CARDIAC STRESS TEST   EKG 12-Lead   PCV ECHOCARDIOGRAM COMPLETE   There are no Patient Instructions  on file for this visit.   --Continue cardiac medications as reconciled in final medication list. --Return in about 6 months (around 12/24/2022) for Follow up dizziness and CAC and HLD. Or sooner if needed. --Continue follow-up with your primary care physician regarding the management of your other chronic comorbid conditions.  Patient's questions and concerns were addressed to his satisfaction. He voices understanding of the instructions provided during this encounter.   This note was created using a voice recognition software as a result there may be grammatical errors inadvertently enclosed that do not reflect the nature of this encounter. Every attempt is made to correct such errors.  Rex Kras, [place], [facility]  Pager: [phone] Office: [phone]

## 2022-06-28 DIAGNOSIS — H34232 Retinal artery branch occlusion, left eye: Secondary | ICD-10-CM | POA: Diagnosis not present

## 2022-06-28 DIAGNOSIS — Z23 Encounter for immunization: Secondary | ICD-10-CM | POA: Diagnosis not present

## 2022-06-28 DIAGNOSIS — H47012 Ischemic optic neuropathy, left eye: Secondary | ICD-10-CM | POA: Diagnosis not present

## 2022-06-28 DIAGNOSIS — R42 Dizziness and giddiness: Secondary | ICD-10-CM | POA: Diagnosis not present

## 2022-06-28 DIAGNOSIS — I517 Cardiomegaly: Secondary | ICD-10-CM | POA: Diagnosis not present

## 2022-06-28 DIAGNOSIS — I119 Hypertensive heart disease without heart failure: Secondary | ICD-10-CM | POA: Diagnosis not present

## 2022-06-28 DIAGNOSIS — I519 Heart disease, unspecified: Secondary | ICD-10-CM | POA: Diagnosis not present

## 2022-06-28 DIAGNOSIS — E7800 Pure hypercholesterolemia, unspecified: Secondary | ICD-10-CM | POA: Diagnosis not present

## 2022-07-12 ENCOUNTER — Other Ambulatory Visit: Payer: PPO

## 2022-08-02 ENCOUNTER — Other Ambulatory Visit: Payer: PPO

## 2022-09-20 DIAGNOSIS — H43813 Vitreous degeneration, bilateral: Secondary | ICD-10-CM | POA: Diagnosis not present

## 2022-09-20 DIAGNOSIS — H2513 Age-related nuclear cataract, bilateral: Secondary | ICD-10-CM | POA: Diagnosis not present

## 2022-09-20 DIAGNOSIS — H348321 Tributary (branch) retinal vein occlusion, left eye, with retinal neovascularization: Secondary | ICD-10-CM | POA: Diagnosis not present

## 2022-12-09 DIAGNOSIS — R7989 Other specified abnormal findings of blood chemistry: Secondary | ICD-10-CM | POA: Diagnosis not present

## 2022-12-09 DIAGNOSIS — E7800 Pure hypercholesterolemia, unspecified: Secondary | ICD-10-CM | POA: Diagnosis not present

## 2022-12-09 DIAGNOSIS — I519 Heart disease, unspecified: Secondary | ICD-10-CM | POA: Diagnosis not present

## 2022-12-09 DIAGNOSIS — Z125 Encounter for screening for malignant neoplasm of prostate: Secondary | ICD-10-CM | POA: Diagnosis not present

## 2022-12-17 DIAGNOSIS — I868 Varicose veins of other specified sites: Secondary | ICD-10-CM | POA: Diagnosis not present

## 2022-12-17 DIAGNOSIS — D692 Other nonthrombocytopenic purpura: Secondary | ICD-10-CM | POA: Diagnosis not present

## 2022-12-17 DIAGNOSIS — E7800 Pure hypercholesterolemia, unspecified: Secondary | ICD-10-CM | POA: Diagnosis not present

## 2022-12-17 DIAGNOSIS — I119 Hypertensive heart disease without heart failure: Secondary | ICD-10-CM | POA: Diagnosis not present

## 2022-12-17 DIAGNOSIS — Z1339 Encounter for screening examination for other mental health and behavioral disorders: Secondary | ICD-10-CM | POA: Diagnosis not present

## 2022-12-17 DIAGNOSIS — Z1331 Encounter for screening for depression: Secondary | ICD-10-CM | POA: Diagnosis not present

## 2022-12-17 DIAGNOSIS — L570 Actinic keratosis: Secondary | ICD-10-CM | POA: Diagnosis not present

## 2022-12-17 DIAGNOSIS — F5221 Male erectile disorder: Secondary | ICD-10-CM | POA: Diagnosis not present

## 2022-12-17 DIAGNOSIS — E663 Overweight: Secondary | ICD-10-CM | POA: Diagnosis not present

## 2022-12-17 DIAGNOSIS — H34232 Retinal artery branch occlusion, left eye: Secondary | ICD-10-CM | POA: Diagnosis not present

## 2022-12-17 DIAGNOSIS — R82998 Other abnormal findings in urine: Secondary | ICD-10-CM | POA: Diagnosis not present

## 2022-12-17 DIAGNOSIS — Z87891 Personal history of nicotine dependence: Secondary | ICD-10-CM | POA: Diagnosis not present

## 2022-12-23 ENCOUNTER — Ambulatory Visit: Payer: PPO | Admitting: Cardiology

## 2022-12-31 ENCOUNTER — Ambulatory Visit: Payer: PPO | Admitting: Cardiology

## 2023-01-16 IMAGING — CT CT CARDIAC CORONARY ARTERY CALCIUM SCORE
3 series · 14 of 20 positions shown, 16 images · non-contrast
Comparison: No priors.

CLINICAL DATA: 69-year-old Caucasian male with history of elevated
cholesterol and family history of coronary artery disease. Former
smoker.

EXAM:
CT CARDIAC CORONARY ARTERY CALCIUM SCORE
TECHNIQUE: Non-contrast imaging through the heart was performed using
prospective ECG gating. Image post processing was performed on an
independent workstation, allowing for quantitative analysis of the
heart and coronary arteries. Note that this exam targets the heart
and the chest was not imaged in its entirety.

[Series 2: calcium scoring 2.00 qr36 bestdiast 70% hrt calciu · axial · 0.37mm/px · z∈[+1675,+1761]mm · 4 of 73 slices shown]
[im 15/73  vessel]
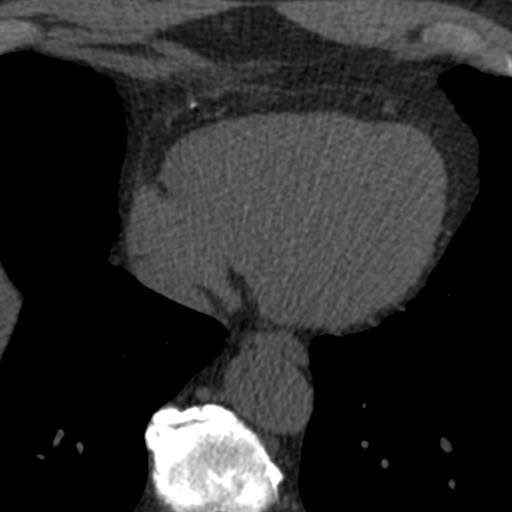
[im 29/73  vessel]
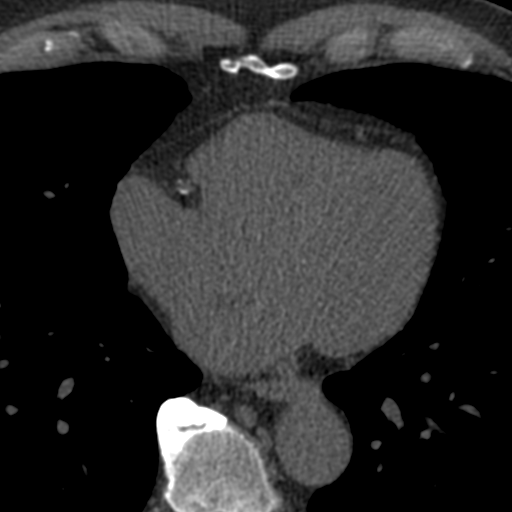
[im 44/73  vessel]
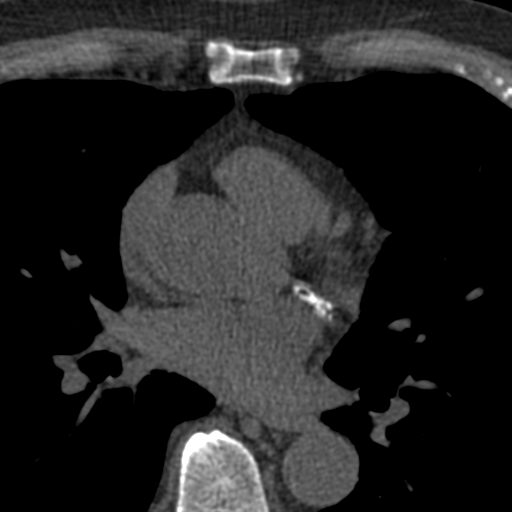
[im 58/73  vessel]
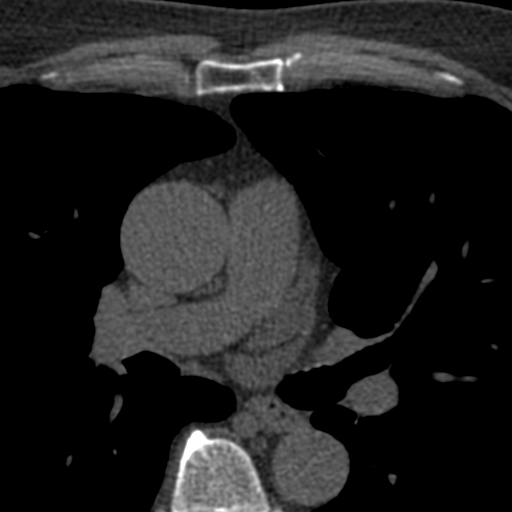

[Series 3: calcium scoring 2.00 br40 bestdiast 70% axial · axial · 0.62mm/px · z∈[+1671,+1767]mm · 5 of 73 slices shown, 7 images]
[im 13/73  vessel]
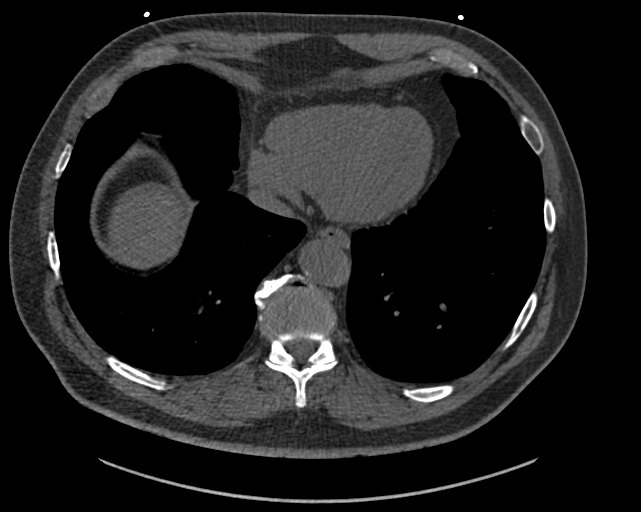
[im 13/73  lung]
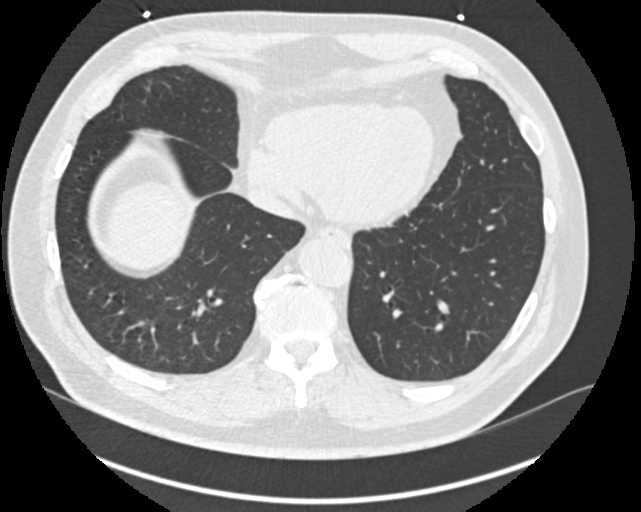
[im 25/73  vessel]
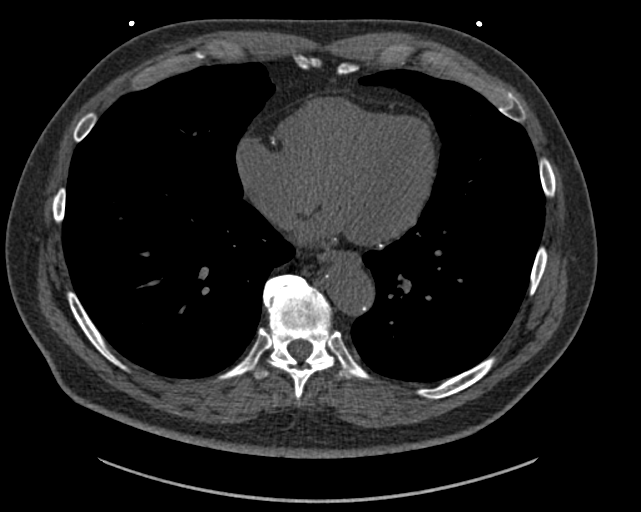
[im 37/73  vessel]
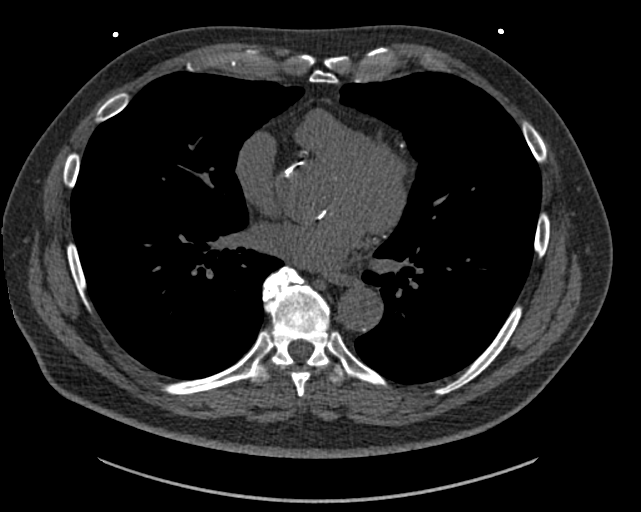
[im 49/73  vessel]
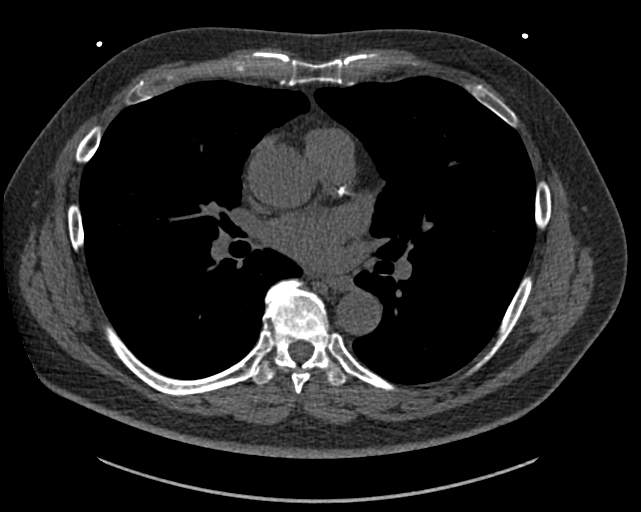
[im 61/73  vessel]
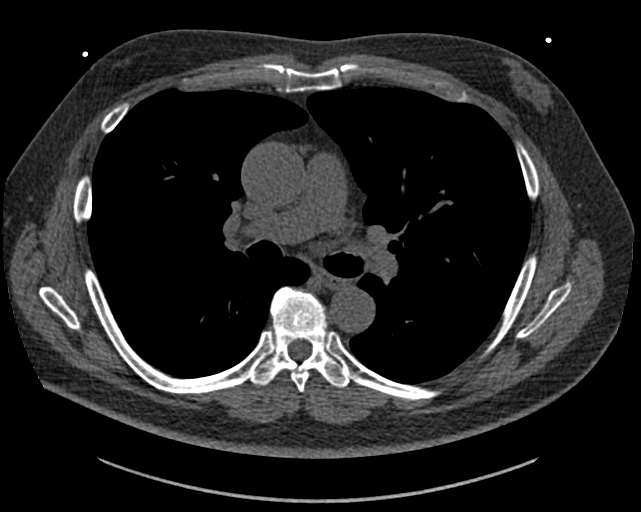
[im 61/73  lung]
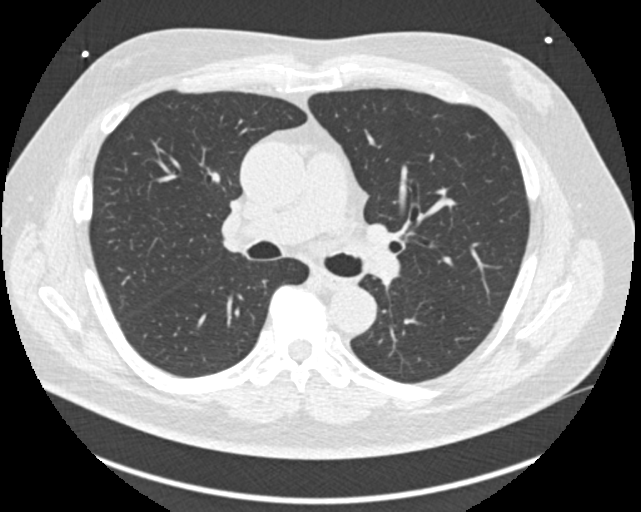

[Series 9: calcium scoring 2.00 br60 bestdiast 70% lungs · axial · 0.62mm/px · z∈[+1671,+1767]mm · 5 of 73 slices shown]
[im 13/73  vessel]
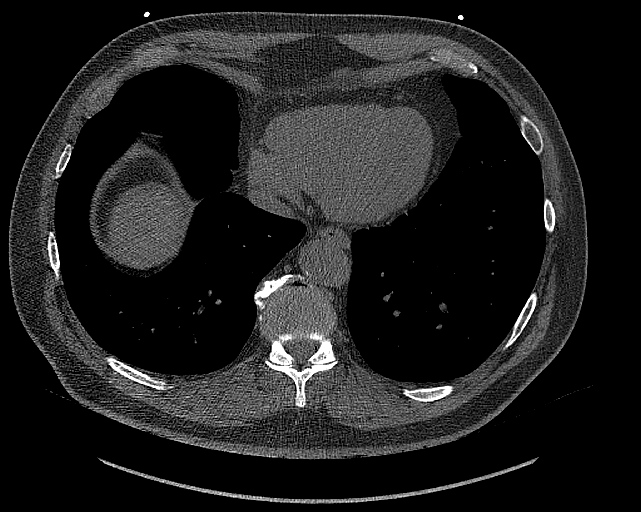
[im 25/73  vessel]
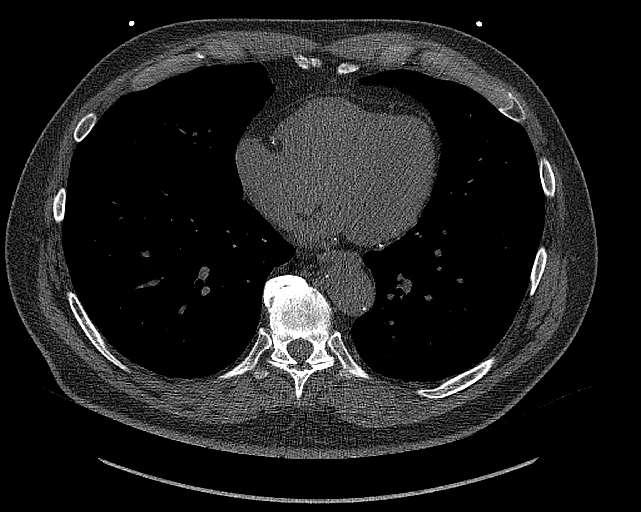
[im 37/73  vessel]
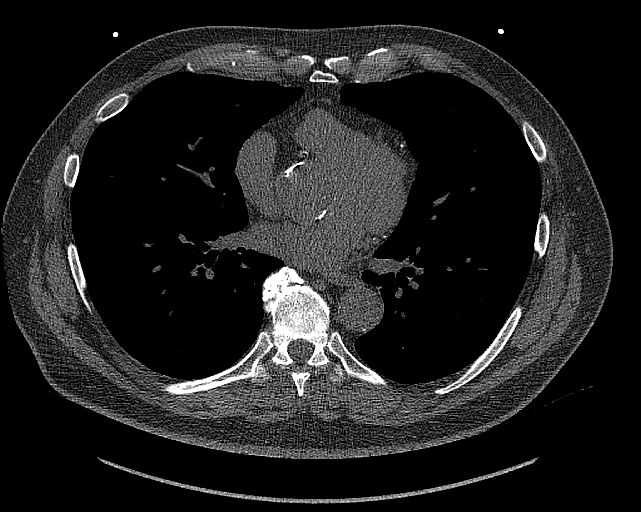
[im 49/73  vessel]
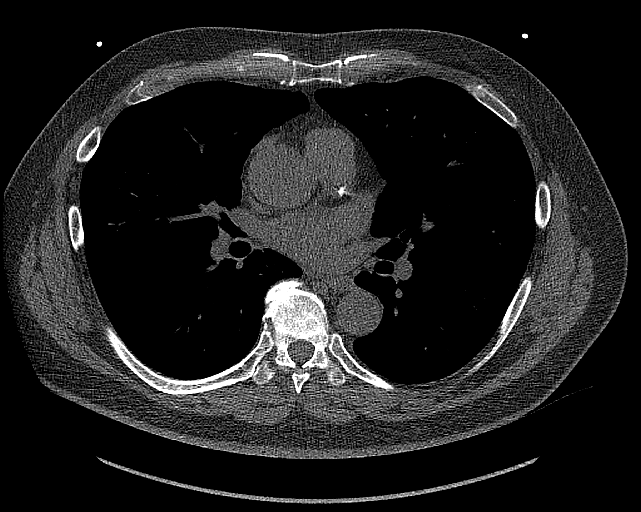
[im 61/73  vessel]
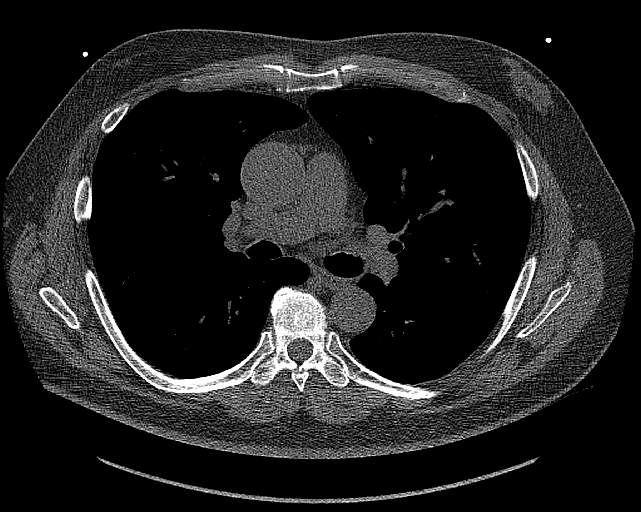

[14 of 20 positions shown; findings below may reference images not displayed]

FINDINGS: CORONARY CALCIUM SCORES:

Left Main: 171

LAD: 625

LCx: 670

RCA: 127

Total Agatston Score: 1,593

[HOSPITAL] percentile: 92nd

AORTA MEASUREMENTS:

Ascending Aorta: 40 mm

Descending Aorta: 27 mm

EXTRACARDIAC FINDINGS:

Within the visualized portions of the thorax there are no suspicious
appearing pulmonary nodules or masses, there is no acute
consolidative airspace disease, no pleural effusions, no
pneumothorax and no lymphadenopathy. Visualized portions of the
upper abdomen are unremarkable. There are no aggressive appearing
lytic or blastic lesions noted in the visualized portions of the
skeleton.
IMPRESSION: 1. Patient's total coronary artery calcium score is 1,593 which is
92nd percentile for patient's of matched age, gender and
race/ethnicity. Please note that although the presence of coronary
artery calcium documents the presence of coronary artery disease,
the severity of this disease and any potential stenosis cannot be
assessed on this noncontrast CT examination. Assessment for
potential risk factor modification, dietary therapy or pharmacologic
therapy may be warranted, if clinically indicated.
2. Ectasia of ascending thoracic aorta (4.0 cm in diameter).
Recommend annual imaging followup by CTA or MRA. This recommendation
follows 9909 ACCF/AHA/AATS/ACR/ASA/SCA/AYAZ/SALLAHI/NILE/FRASZT Guidelines
for the Diagnosis and Management of Patients with Thoracic Aortic
Disease. Circulation. 9909; 121: E266-e369. Aortic aneurysm NOS
(7JBM4-AF8.0).

## 2023-06-24 DIAGNOSIS — L814 Other melanin hyperpigmentation: Secondary | ICD-10-CM | POA: Diagnosis not present

## 2023-06-24 DIAGNOSIS — D485 Neoplasm of uncertain behavior of skin: Secondary | ICD-10-CM | POA: Diagnosis not present

## 2023-07-28 DIAGNOSIS — D225 Melanocytic nevi of trunk: Secondary | ICD-10-CM | POA: Diagnosis not present

## 2023-07-28 DIAGNOSIS — L821 Other seborrheic keratosis: Secondary | ICD-10-CM | POA: Diagnosis not present

## 2023-07-28 DIAGNOSIS — L814 Other melanin hyperpigmentation: Secondary | ICD-10-CM | POA: Diagnosis not present

## 2023-07-28 DIAGNOSIS — Z7189 Other specified counseling: Secondary | ICD-10-CM | POA: Diagnosis not present

## 2023-07-28 DIAGNOSIS — C44311 Basal cell carcinoma of skin of nose: Secondary | ICD-10-CM | POA: Diagnosis not present

## 2023-10-22 DIAGNOSIS — C44311 Basal cell carcinoma of skin of nose: Secondary | ICD-10-CM | POA: Diagnosis not present

## 2023-12-22 DIAGNOSIS — Z1212 Encounter for screening for malignant neoplasm of rectum: Secondary | ICD-10-CM | POA: Diagnosis not present

## 2023-12-22 DIAGNOSIS — Z125 Encounter for screening for malignant neoplasm of prostate: Secondary | ICD-10-CM | POA: Diagnosis not present

## 2023-12-22 DIAGNOSIS — E7800 Pure hypercholesterolemia, unspecified: Secondary | ICD-10-CM | POA: Diagnosis not present

## 2023-12-22 DIAGNOSIS — I119 Hypertensive heart disease without heart failure: Secondary | ICD-10-CM | POA: Diagnosis not present

## 2023-12-29 DIAGNOSIS — I119 Hypertensive heart disease without heart failure: Secondary | ICD-10-CM | POA: Diagnosis not present

## 2023-12-29 DIAGNOSIS — D692 Other nonthrombocytopenic purpura: Secondary | ICD-10-CM | POA: Diagnosis not present

## 2023-12-29 DIAGNOSIS — L570 Actinic keratosis: Secondary | ICD-10-CM | POA: Diagnosis not present

## 2023-12-29 DIAGNOSIS — E663 Overweight: Secondary | ICD-10-CM | POA: Diagnosis not present

## 2023-12-29 DIAGNOSIS — R82998 Other abnormal findings in urine: Secondary | ICD-10-CM | POA: Diagnosis not present

## 2023-12-29 DIAGNOSIS — Z1331 Encounter for screening for depression: Secondary | ICD-10-CM | POA: Diagnosis not present

## 2023-12-29 DIAGNOSIS — Z1339 Encounter for screening examination for other mental health and behavioral disorders: Secondary | ICD-10-CM | POA: Diagnosis not present

## 2023-12-29 DIAGNOSIS — Z87891 Personal history of nicotine dependence: Secondary | ICD-10-CM | POA: Diagnosis not present

## 2023-12-29 DIAGNOSIS — E7800 Pure hypercholesterolemia, unspecified: Secondary | ICD-10-CM | POA: Diagnosis not present

## 2023-12-29 DIAGNOSIS — H47012 Ischemic optic neuropathy, left eye: Secondary | ICD-10-CM | POA: Diagnosis not present

## 2023-12-29 DIAGNOSIS — Z0000 Encounter for general adult medical examination without abnormal findings: Secondary | ICD-10-CM | POA: Diagnosis not present

## 2023-12-29 DIAGNOSIS — I868 Varicose veins of other specified sites: Secondary | ICD-10-CM | POA: Diagnosis not present

## 2024-01-14 DIAGNOSIS — H2513 Age-related nuclear cataract, bilateral: Secondary | ICD-10-CM | POA: Diagnosis not present

## 2024-01-14 DIAGNOSIS — H348321 Tributary (branch) retinal vein occlusion, left eye, with retinal neovascularization: Secondary | ICD-10-CM | POA: Diagnosis not present

## 2024-01-14 DIAGNOSIS — H43813 Vitreous degeneration, bilateral: Secondary | ICD-10-CM | POA: Diagnosis not present

## 2024-01-27 DIAGNOSIS — D2362 Other benign neoplasm of skin of left upper limb, including shoulder: Secondary | ICD-10-CM | POA: Diagnosis not present

## 2024-01-27 DIAGNOSIS — C44629 Squamous cell carcinoma of skin of left upper limb, including shoulder: Secondary | ICD-10-CM | POA: Diagnosis not present

## 2024-02-10 DIAGNOSIS — M546 Pain in thoracic spine: Secondary | ICD-10-CM | POA: Diagnosis not present

## 2024-02-10 DIAGNOSIS — M9902 Segmental and somatic dysfunction of thoracic region: Secondary | ICD-10-CM | POA: Diagnosis not present

## 2024-03-15 ENCOUNTER — Other Ambulatory Visit: Payer: Self-pay | Admitting: Cardiology

## 2024-03-15 MED ORDER — ATORVASTATIN CALCIUM 80 MG PO TABS: 80.0000 mg | ORAL_TABLET | Freq: Every day | ORAL | 0 refills | Status: DC

## 2024-03-23 ENCOUNTER — Ambulatory Visit: Admitting: Cardiology

## 2024-04-05 DIAGNOSIS — C44629 Squamous cell carcinoma of skin of left upper limb, including shoulder: Secondary | ICD-10-CM | POA: Diagnosis not present

## 2024-04-07 ENCOUNTER — Ambulatory Visit: Attending: Cardiology | Admitting: Cardiology

## 2024-04-07 ENCOUNTER — Encounter: Payer: Self-pay | Admitting: Cardiology

## 2024-04-07 VITALS — BP 143/80 | HR 72 | Resp 16 | Ht 69.0 in | Wt 183.6 lb

## 2024-04-07 DIAGNOSIS — I10 Essential (primary) hypertension: Secondary | ICD-10-CM

## 2024-04-07 DIAGNOSIS — I2584 Coronary atherosclerosis due to calcified coronary lesion: Secondary | ICD-10-CM | POA: Diagnosis not present

## 2024-04-07 DIAGNOSIS — I77810 Thoracic aortic ectasia: Secondary | ICD-10-CM

## 2024-04-07 DIAGNOSIS — E7800 Pure hypercholesterolemia, unspecified: Secondary | ICD-10-CM | POA: Diagnosis not present

## 2024-04-07 DIAGNOSIS — I2510 Atherosclerotic heart disease of native coronary artery without angina pectoris: Secondary | ICD-10-CM

## 2024-04-07 MED ORDER — LOSARTAN POTASSIUM 25 MG PO TABS: 25.0000 mg | ORAL_TABLET | Freq: Every day | ORAL | 3 refills | Status: DC

## 2024-04-07 NOTE — Patient Instructions (Addendum)
 Medication Instructions:  START Losartan  25 mg once daily in the mornings  *If you need a refill on your cardiac medications before your next appointment, please call your pharmacy*  Lab Work: To be completed in 1 week: FASTING lipids and CMP  If you have labs (blood work) drawn today and your tests are completely normal, you will receive your results only by: MyChart Message (if you have MyChart) OR A paper copy in the mail If you have any lab test that is abnormal or we need to change your treatment, we will call you to review the results.  Testing/Procedures: Your provider has requested that you have a chest CT without contrast. Non-Cardiac CT scanning, (CAT scanning), is a noninvasive, special x-ray that produces cross-sectional images of the body using x-rays and a computer. CT scans help physicians diagnose and treat medical conditions. For some CT exams, a contrast material is used to enhance visibility in the area of the body being studied. CT scans provide greater clarity and reveal more details than regular x-ray exams.   Follow-Up: At [facility], you and your health needs are our priority.  As part of our continuing mission to provide you with exceptional heart care, our providers are all part of one team.  This team includes your primary Cardiologist (physician) and Advanced Practice Providers or APPs (Physician Assistants and Nurse Practitioners) who all work together to provide you with the care you need, when you need it.  Your next appointment:   1 year(s)  Provider:   Madonna Large, DO

## 2024-04-07 NOTE — Progress Notes (Signed)
 Cardiology Office Note:  .   Date:  04/07/2024  ID:  Johnny Lopez, DOB 01-02-1951, MRN [id number] PCP:  Vernadine Charlie ORN, MD  Former Cardiology Providers: Dr. Bl[facility] Providers Cardiologist:  Madonna Large, DO , [facility] (established care 12/27/2020) Electrophysiologist:  None  Click to update primary MD,subspecialty MD or APP then REFRESH:1}    Chief Complaint  Patient presents with   Follow-up    Coronary artery calcification    History of Present Illness: .   Johnny Lopez is a 73 y.o. Caucasian male whose past medical history and cardiovascular risk factors includes: Hyperlipidemia, former smoker, carotid artery atherosclerosis, dilated ascending aorta, severe coronary artery calcification, retinal artery occlusion, ischemic optic neuropathy, hypertension, varicose veins, advanced age.   Patient was referred to the practice for evaluation of heart disease. He was noted to have hypercholesterolemia and severe coronary artery calcification. He underwent ischemic work-up as outlined below.   Last seen in the office in October 2023 presents today for follow-up.  Since last office visit patient denies any anginal chest pain or heart failure symptoms.  No hospitalizations or urgent care visits for cardiovascular reasons.  He has been compliant with his medical therapy. 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 Does not check his BP at home. States that his BP is elevated at doctor's office and it was normal at his PCP office.   Had briefly stopped taking statins for 6 months but noted his cholesterol went up so restarted. He started taking Lipitor in April.   Review of Systems: .   Review of Systems  Cardiovascular:  Negative for chest pain, claudication, irregular heartbeat, leg swelling, near-syncope, orthopnea, palpitations, paroxysmal nocturnal dyspnea and syncope.  Respiratory:  Negative for shortness of breath.   Hematologic/Lymphatic:  Negative for bleeding problem.    Studies Reviewed:   EKG: EKG Interpretation Date/Time:  Wednesday April 07 2024 08:15:12 EDT Ventricular Rate:  64 PR Interval:  186 QRS Duration:  76 QT Interval:  382 QTC Calculation: 394 R Axis:   57  Text Interpretation: Normal sinus rhythm Cannot rule out Anterior infarct (cited on or before 24-Jun-2022) When compared with ECG of 24-Jun-2022 14:36, No significant change was found Confirmed by Large Madonna [id number]) on 04/07/2024 8:32:05 AM  Echocardiogram: 02/01/2021:  Normal LV systolic function with visual EF 55-60%. Left ventricle cavity is normal in size. Mild left ventricular hypertrophy. Normal global wall  motion. Normal diastolic filling pattern, normal LAP.  Mild tricuspid regurgitation. No evidence of pulmonary hypertension. RVSP measures 31 mmHg.  IVC is normal with a respiratory response of <50%.  No prior study for comparison.   Coronary artery calcification scoring performed on 02/05/2021: Left main: 171 Left anterior descending: 625 Left circumflex: 670 Right coronary artery: 127 Total calcium  score: 1593 AU, 92nd percentile Ascending aorta 40 mm-recommend annual imaging follow-up with CTA or MRA.    Stress Testing: Exercise Sestamibi stress test 03/21/2021: 1 Day Rest/Stress Protocol. Exercise time 7 minutes 29 seconds on Bruce protocol, achieved 9.34 METS, 98% of APMHR Stress ECG negative for ischemia.  No convincing evidence of reversible myocardial ischemia or prior infarct.  Left ventricular ejection fraction is 56% with normal wall motion.  Low risk study.   Carotid artery duplex 01/30/2021:  Minimal plaque in the bilateral carotid arteries (heterogeneous plaque).  Antegrade right vertebral artery flow. Antegrade left vertebral artery  flow.  Follow up studies if clinically indicated.  RADIOLOGY: NA  Risk Assessment/Calculations:   NA  Labs:       Latest Ref Rng & Units 06/24/2022   10:14 AM 04/15/2010    11:39 PM  CBC  WBC 4.0 - 10.5 K/uL 5.8  4.7   Hemoglobin 13.0 - 17.0 g/dL 84.8  85.9   Hematocrit 39.0 - 52.0 % 45.8  39.2   Platelets 150 - 400 K/uL 247  187        Latest Ref Rng & Units 06/24/2022   10:14 AM 04/02/2021    8:08 AM 02/09/2021    8:34 AM  BMP  Glucose 70 - 99 mg/dL 896  893  895   BUN 8 - 23 mg/dL 8  8  10    Creatinine 0.61 - 1.24 mg/dL 8.94  9.00  8.96   BUN/Creat Ratio 10 - 24  8  10    Sodium 135 - 145 mmol/L 138  142  136   Potassium 3.5 - 5.1 mmol/L 4.0  4.9  4.6   Chloride 98 - 111 mmol/L 101  106  100   CO2 22 - 32 mmol/L 24  23  25    Calcium  8.9 - 10.3 mg/dL 9.7  9.2  9.3       Latest Ref Rng & Units 06/24/2022   10:14 AM 04/02/2021    8:08 AM 02/09/2021    8:34 AM  CMP  Glucose 70 - 99 mg/dL 896  893  895   BUN 8 - 23 mg/dL 8  8  10    Creatinine 0.61 - 1.24 mg/dL 8.94  9.00  8.96   Sodium 135 - 145 mmol/L 138  142  136   Potassium 3.5 - 5.1 mmol/L 4.0  4.9  4.6   Chloride 98 - 111 mmol/L 101  106  100   CO2 22 - 32 mmol/L 24  23  25    Calcium  8.9 - 10.3 mg/dL 9.7  9.2  9.3   Total Protein 6.0 - 8.5 g/dL  6.3  6.6   Total Bilirubin 0.0 - 1.2 mg/dL  1.8  2.3   Alkaline Phos 44 - 121 IU/L  69  74   AST 0 - 40 IU/L  23  33   ALT 0 - 44 IU/L  22  26     Lab Results  Component Value Date   CHOL 129 04/02/2021   HDL 35 (L) 04/02/2021   LDLCALC 74 04/02/2021   LDLDIRECT 73 04/02/2021   TRIG 106 04/02/2021   No results for input(s): LIPOA in the last 8760 hours. No components found for: NTPROBNP No results for input(s): PROBNP in the last 8760 hours. No results for input(s): TSH in the last 8760 hours.  Physical Exam:    Today's Vitals   04/07/24 0811 04/07/24 0843  BP: (!) 138/90 (!) 143/80  Pulse: 63 72  Resp: 16   SpO2: 98%   Weight: 183 lb 9.6 oz (83.3 kg)   Height: 5' 9 (1.753 m)    Body mass index is 27.11 kg/m. Wt Readings from Last 3 Encounters:  04/07/24 183 lb 9.6 oz (83.3 kg)  06/25/22 184 lb 3.2 oz (83.6 kg)   06/24/22 175 lb (79.4 kg)    Physical Exam  Constitutional: No distress.  hemodynamically stable  Neck: No JVD present.  Cardiovascular: Normal rate, regular rhythm, S1 normal and S2 normal. Exam reveals no gallop, no S3 and no S4.  No murmur heard. Pulmonary/Chest: Effort normal and breath sounds normal. No stridor. He has no wheezes. He has no rales.  Musculoskeletal:        General: No edema.     Cervical back: Neck supple.  Skin: Skin is warm.   Impression & Recommendation(s):  Impression:   ICD-10-CM   1. Coronary atherosclerosis due to calcified coronary lesion  I25.10 EKG 12-Lead   I25.84     2. Ascending aorta dilatation (HCC)  I77.810     3. Pure hypercholesterolemia  E78.00     4. Primary hypertension  I10        Recommendation(s):  Coronary atherosclerosis due to calcified coronary lesion Coronary calcium  score June 2022: Total calcium  score: 1593 AU, 92nd percentile Prior echocardiogram noted preserved LVEF and no significant valvular heart disease. Exercise nuclear stress test July 2022: Low risk study No active chest pain or heart failure symptoms. Continue aspirin 81 mg p.o. daily. Continue Lipitor  Ascending aorta dilatation (HCC) Coronary calcium  score in June 2022 reported ascending aorta at 40 mm. He was recommended to have annual imaging to follow overall disease progression. No recent studies available for review. Will order CT chest without contrast to evaluate the dimensions of the ascending aorta. Reemphasized importance of appropriate blood pressure management, with a goal SBP of 120 mmHg if able to tolerate. You should avoid use of Ciprofloxacin and other associated antibiotics (flouroquinolone antibiotics ) It is  best to avoid activities that cause grunting or straining (medically referred to as a "valsalva maneuver"). This happens when a person bears down against a closed throat to increase the strength of arm or abdominal muscles. There's  often a tendency to do this when lifting heavy weights, doing sit-ups, push-ups or chin-ups, etc., but it may be harmful.   Pure hypercholesterolemia Has been on Lipitor 80 mg p.o. daily. Stopped taking it for at least 6 months. Had repeat labs with his annual physical in April 2025 there was noted to have an LDL of 165 mg/dL. Since then he has restarted Lipitor but no repeat blood work has been performed. Will recheck fasting lipids and CMP  Primary hypertension Office blood pressures are not well-controlled. Does not check his blood pressures at home often, reemphasized its importance. Given the dilated ascending aorta and elevated blood pressure readings recommended starting pharmacological therapy. Will start losartan  25 mg p.o. every morning with labs in 1 week to evaluate kidney function and potassium levels.  Orders Placed:  Orders Placed This Encounter  Procedures   EKG 12-Lead     Final Medication List:   No orders of the defined types were placed in this encounter.   There are no discontinued medications.   Current Outpatient Medications:    aspirin 81 MG chewable tablet, Chew by mouth daily., Disp: , Rfl:    atorvastatin  (LIPITOR) 80 MG tablet, Take 1 tablet (80 mg total) by mouth daily. Please keep upcoming appointment with Dr. Michele to keep scheduled refills., Disp: 90 tablet, Rfl: 0   Coenzyme Q10 (CO Q 10 PO), Take 100 mg by mouth daily. Liquid, Disp: , Rfl:    Omega-3 Fatty Acids (OMEGA-3 FISH OIL) 1200 MG CAPS, Take 1,200 mg by mouth daily., Disp: , Rfl:    sildenafil (REVATIO) 20 MG tablet, Take 1 tablet by mouth as needed., Disp: , Rfl:   Consent:   NA  Disposition:   1 year follow-up sooner if needed  His questions and concerns were addressed to his satisfaction. He voices understanding of the recommendations provided during this encounter.    Signed, Madonna Michele, DO, [facility]  A Division of [facility] [address] [place], [place] [address]  [place], [place] [address] 04/07/2024 8:44 AM

## 2024-04-08 ENCOUNTER — Ambulatory Visit: Payer: Self-pay | Admitting: Cardiology

## 2024-04-08 DIAGNOSIS — I77810 Thoracic aortic ectasia: Secondary | ICD-10-CM

## 2024-04-08 DIAGNOSIS — I10 Essential (primary) hypertension: Secondary | ICD-10-CM

## 2024-04-08 LAB — COMPREHENSIVE METABOLIC PANEL WITH GFR
ALT: 22 IU/L (ref 0–44)
AST: 25 IU/L (ref 0–40)
Albumin: 4.8 g/dL (ref 3.8–4.8)
Alkaline Phosphatase: 67 IU/L (ref 44–121)
BUN/Creatinine Ratio: 16 (ref 10–24)
BUN: 15 mg/dL (ref 8–27)
Bilirubin Total: 1.7 mg/dL — ABNORMAL HIGH (ref 0.0–1.2)
CO2: 20 mmol/L (ref 20–29)
Calcium: 9.1 mg/dL (ref 8.6–10.2)
Chloride: 102 mmol/L (ref 96–106)
Creatinine, Ser: 0.91 mg/dL (ref 0.76–1.27)
Globulin, Total: 2.2 g/dL (ref 1.5–4.5)
Glucose: 94 mg/dL (ref 70–99)
Potassium: 4.3 mmol/L (ref 3.5–5.2)
Sodium: 140 mmol/L (ref 134–144)
Total Protein: 7 g/dL (ref 6.0–8.5)
eGFR: 90 mL/min/1.73 (ref >59)

## 2024-04-08 LAB — LIPID PANEL
Chol/HDL Ratio: 4.4 ratio (ref 0.0–5.0)
Cholesterol, Total: 157 mg/dL (ref 100–199)
HDL: 36 mg/dL — ABNORMAL LOW (ref >39)
LDL Chol Calc (NIH): 102 mg/dL — ABNORMAL HIGH (ref 0–99)
Triglycerides: 105 mg/dL (ref 0–149)
VLDL Cholesterol Cal: 19 mg/dL (ref 5–40)

## 2024-04-16 ENCOUNTER — Telehealth: Payer: Self-pay | Admitting: Cardiology

## 2024-04-16 ENCOUNTER — Ambulatory Visit (HOSPITAL_BASED_OUTPATIENT_CLINIC_OR_DEPARTMENT_OTHER)
Admission: RE | Admit: 2024-04-16 | Discharge: 2024-04-16 | Disposition: A | Discharge status: Home / Self Care | Source: Ambulatory Visit | Attending: Cardiology | Admitting: Cardiology

## 2024-04-16 DIAGNOSIS — I7121 Aneurysm of the ascending aorta, without rupture: Secondary | ICD-10-CM | POA: Diagnosis not present

## 2024-04-16 DIAGNOSIS — I77810 Thoracic aortic ectasia: Secondary | ICD-10-CM | POA: Insufficient documentation

## 2024-04-16 DIAGNOSIS — I10 Essential (primary) hypertension: Secondary | ICD-10-CM | POA: Diagnosis not present

## 2024-04-16 DIAGNOSIS — I700 Atherosclerosis of aorta: Secondary | ICD-10-CM | POA: Diagnosis not present

## 2024-04-16 NOTE — Telephone Encounter (Signed)
 Envelope placed in outgoing mail in error.  Will look for it when returned.

## 2024-04-16 NOTE — Telephone Encounter (Signed)
 Dropped off blood pressure reading In providers box

## 2024-04-17 LAB — BASIC METABOLIC PANEL WITH GFR
BUN/Creatinine Ratio: 11 (ref 10–24)
BUN: 12 mg/dL (ref 8–27)
CO2: 22 mmol/L (ref 20–29)
Calcium: 9.5 mg/dL (ref 8.6–10.2)
Chloride: 102 mmol/L (ref 96–106)
Creatinine, Ser: 1.05 mg/dL (ref 0.76–1.27)
Glucose: 96 mg/dL (ref 70–99)
Potassium: 4.4 mmol/L (ref 3.5–5.2)
Sodium: 140 mmol/L (ref 134–144)
eGFR: 75 mL/min/1.73 (ref >59)

## 2024-08-02 DIAGNOSIS — D2239 Melanocytic nevi of other parts of face: Secondary | ICD-10-CM | POA: Diagnosis not present

## 2024-08-02 DIAGNOSIS — L719 Rosacea, unspecified: Secondary | ICD-10-CM | POA: Diagnosis not present

## 2024-08-02 DIAGNOSIS — L570 Actinic keratosis: Secondary | ICD-10-CM | POA: Diagnosis not present

## 2024-08-05 ENCOUNTER — Other Ambulatory Visit: Payer: Self-pay | Admitting: Cardiology

## 2024-08-05 DIAGNOSIS — I10 Essential (primary) hypertension: Secondary | ICD-10-CM

## 2024-08-06 ENCOUNTER — Other Ambulatory Visit: Payer: Self-pay | Admitting: Cardiology

## 2024-10-01 ENCOUNTER — Telehealth: Payer: Self-pay | Admitting: Cardiology

## 2024-10-01 NOTE — Telephone Encounter (Signed)
 Pt c/o BP issue: STAT if pt c/o blurred vision, one-sided weakness or slurred speech.  STAT if BP is GREATER than 180/120 TODAY.  STAT if BP is LESS than 90/60 and SYMPTOMATIC TODAY  1. What is your BP concern? BP is elevated   2. Have you taken any BP medication today? Yes   3. What are your last 5 BP readings?98/79  135/94 117/78 135/94  4. Are you having any other symptoms (ex. Dizziness, headache, blurred vision, passed out)? A little dizzy

## 2024-10-01 NOTE — Telephone Encounter (Signed)
 Pt states he has been taking his medications daily and started having occasional elevated BP for last several days.  BP readings over last few days: 139/90 98/79 135/94 117/78 135/94 1pm 2/6 110/75 HR 84 currently Denies CP, just occasional twitching in chest  Pt states he hasn't eaten much in last several days, but is remaining hydrated. Pt states he feels dizzy at times. Encouraged pt to try to eat small meals to at least get something on his stomach.  Pt states he takes his blood pressure in same arm and location, but some of these readings were before and after taking medication.  Pt also takes Vitamin K2 with mk7 Pt given ED precautions if SBP remains elevated >180 or if he develops any chest pain.  Will send to Dr Michele for any further recommendations at this time.
